# Patient Record
Sex: Female | Born: 1975 | Race: Black or African American | Hispanic: No | Marital: Single | Smoking: Former smoker
Health system: Southern US, Community
[De-identification: ages and names within clinical notes are randomized; demographics above are authoritative.]

## PROBLEM LIST (undated history)

## (undated) ENCOUNTER — Inpatient Hospital Stay (HOSPITAL_COMMUNITY): Payer: Self-pay

## (undated) DIAGNOSIS — F419 Anxiety disorder, unspecified: Secondary | ICD-10-CM

## (undated) DIAGNOSIS — D649 Anemia, unspecified: Secondary | ICD-10-CM

## (undated) DIAGNOSIS — D219 Benign neoplasm of connective and other soft tissue, unspecified: Secondary | ICD-10-CM

## (undated) DIAGNOSIS — B009 Herpesviral infection, unspecified: Secondary | ICD-10-CM

## (undated) DIAGNOSIS — I10 Essential (primary) hypertension: Secondary | ICD-10-CM

## (undated) DIAGNOSIS — M1990 Unspecified osteoarthritis, unspecified site: Secondary | ICD-10-CM

## (undated) DIAGNOSIS — Z8759 Personal history of other complications of pregnancy, childbirth and the puerperium: Secondary | ICD-10-CM

## (undated) DIAGNOSIS — F32A Depression, unspecified: Secondary | ICD-10-CM

## (undated) DIAGNOSIS — R519 Headache, unspecified: Secondary | ICD-10-CM

## (undated) DIAGNOSIS — R7303 Prediabetes: Secondary | ICD-10-CM

## (undated) DIAGNOSIS — R51 Headache: Secondary | ICD-10-CM

## (undated) DIAGNOSIS — E559 Vitamin D deficiency, unspecified: Secondary | ICD-10-CM

## (undated) HISTORY — PX: URETHRAL CYST REMOVAL: SHX5128

## (undated) HISTORY — PX: THERAPEUTIC ABORTION: SHX798

---

## 1998-01-26 ENCOUNTER — Emergency Department (HOSPITAL_COMMUNITY): Admission: EM | Admit: 1998-01-26 | Discharge: 1998-01-26 | Payer: Self-pay | Admitting: Emergency Medicine

## 1998-06-20 ENCOUNTER — Other Ambulatory Visit: Admission: RE | Admit: 1998-06-20 | Discharge: 1998-06-20 | Payer: Self-pay | Admitting: Gynecology

## 2000-04-20 ENCOUNTER — Emergency Department (HOSPITAL_COMMUNITY): Admission: EM | Admit: 2000-04-20 | Discharge: 2000-04-20 | Payer: Self-pay | Admitting: Emergency Medicine

## 2001-09-16 ENCOUNTER — Other Ambulatory Visit: Admission: RE | Admit: 2001-09-16 | Discharge: 2001-09-16 | Payer: Self-pay | Admitting: Obstetrics and Gynecology

## 2003-01-25 ENCOUNTER — Other Ambulatory Visit: Admission: RE | Admit: 2003-01-25 | Discharge: 2003-01-25 | Payer: Self-pay | Admitting: Obstetrics and Gynecology

## 2008-04-22 ENCOUNTER — Emergency Department (HOSPITAL_COMMUNITY): Admission: EM | Admit: 2008-04-22 | Discharge: 2008-04-22 | Payer: Self-pay | Admitting: Emergency Medicine

## 2010-06-12 ENCOUNTER — Emergency Department (HOSPITAL_BASED_OUTPATIENT_CLINIC_OR_DEPARTMENT_OTHER)
Admission: EM | Admit: 2010-06-12 | Discharge: 2010-06-12 | Disposition: A | Discharge status: Home / Self Care | Payer: Federal, State, Local not specified - PPO | Attending: Emergency Medicine | Admitting: Emergency Medicine

## 2010-06-12 ENCOUNTER — Emergency Department (INDEPENDENT_AMBULATORY_CARE_PROVIDER_SITE_OTHER): Payer: Federal, State, Local not specified - PPO

## 2010-06-12 DIAGNOSIS — R079 Chest pain, unspecified: Secondary | ICD-10-CM

## 2010-06-12 DIAGNOSIS — R0602 Shortness of breath: Secondary | ICD-10-CM

## 2010-06-12 DIAGNOSIS — M546 Pain in thoracic spine: Secondary | ICD-10-CM | POA: Insufficient documentation

## 2010-06-12 LAB — COMPREHENSIVE METABOLIC PANEL
ALT: 21 U/L (ref 0–35)
AST: 20 U/L (ref 0–37)
Alkaline Phosphatase: 66 U/L (ref 39–117)
Calcium: 8.9 mg/dL (ref 8.4–10.5)
GFR calc Af Amer: >60 mL/min (ref >60)
Glucose, Bld: 92 mg/dL (ref 70–99)
Potassium: 3.5 mEq/L (ref 3.5–5.1)
Sodium: 139 mEq/L (ref 135–145)
Total Protein: 7.7 g/dL (ref 6.0–8.3)

## 2010-06-12 LAB — DIFFERENTIAL
Basophils Absolute: 0 10*3/uL (ref 0.0–0.1)
Basophils Relative: 0 % (ref 0–1)
Eosinophils Relative: 1 % (ref 0–5)
Lymphocytes Relative: 41 % (ref 12–46)
Neutro Abs: 2.8 10*3/uL (ref 1.7–7.7)

## 2010-06-12 LAB — POCT CARDIAC MARKERS
CKMB, poc: <1 ng/mL — ABNORMAL LOW (ref 1.0–8.0)
CKMB, poc: <1 ng/mL — ABNORMAL LOW (ref 1.0–8.0)
Myoglobin, poc: 46.2 ng/mL (ref 12–200)
Myoglobin, poc: 43.2 ng/mL (ref 12–200)
Troponin i, poc: <0.05 ng/mL (ref 0.00–0.09)

## 2010-06-12 LAB — CBC
HCT: 36.5 % (ref 36.0–46.0)
Hemoglobin: 12.5 g/dL (ref 12.0–15.0)
MCHC: 34.2 g/dL (ref 30.0–36.0)
RDW: 12.1 % (ref 11.5–15.5)
WBC: 5.4 10*3/uL (ref 4.0–10.5)

## 2010-06-12 LAB — LIPASE, BLOOD: Lipase: 64 U/L (ref 23–300)

## 2010-08-01 ENCOUNTER — Other Ambulatory Visit (HOSPITAL_COMMUNITY): Payer: Self-pay | Admitting: Urology

## 2010-08-01 DIAGNOSIS — IMO0001 Reserved for inherently not codable concepts without codable children: Secondary | ICD-10-CM

## 2010-08-01 DIAGNOSIS — N2882 Megaloureter: Secondary | ICD-10-CM

## 2010-08-07 ENCOUNTER — Ambulatory Visit (HOSPITAL_COMMUNITY)
Admission: RE | Admit: 2010-08-07 | Discharge: 2010-08-07 | Disposition: A | Discharge status: Home / Self Care | Payer: Federal, State, Local not specified - PPO | Source: Ambulatory Visit | Attending: Urology | Admitting: Urology

## 2010-08-07 DIAGNOSIS — D259 Leiomyoma of uterus, unspecified: Secondary | ICD-10-CM | POA: Insufficient documentation

## 2010-08-07 DIAGNOSIS — N368 Other specified disorders of urethra: Secondary | ICD-10-CM | POA: Insufficient documentation

## 2010-08-07 DIAGNOSIS — IMO0001 Reserved for inherently not codable concepts without codable children: Secondary | ICD-10-CM

## 2010-08-07 MED ORDER — GADOBENATE DIMEGLUMINE 529 MG/ML IV SOLN
20.0000 mL | Freq: Once | INTRAVENOUS | Status: AC | PRN
  Administered 2010-08-07: 20 mL via INTRAVENOUS

## 2010-11-16 ENCOUNTER — Ambulatory Visit (HOSPITAL_BASED_OUTPATIENT_CLINIC_OR_DEPARTMENT_OTHER)
Admission: RE | Admit: 2010-11-16 | Discharge: 2010-11-16 | Disposition: A | Discharge status: Home / Self Care | Payer: Federal, State, Local not specified - PPO | Source: Ambulatory Visit | Attending: Urology | Admitting: Urology

## 2010-11-16 ENCOUNTER — Other Ambulatory Visit: Payer: Self-pay | Admitting: Urology

## 2010-11-16 DIAGNOSIS — N398 Other specified disorders of urinary system: Secondary | ICD-10-CM | POA: Insufficient documentation

## 2010-11-18 LAB — CULTURE, ROUTINE-GENITAL

## 2010-11-20 NOTE — Op Note (Signed)
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  OPERATION:  Transvaginal excision of infected periurethral cyst.  SURGEON:  Maureen Schwartz, M.D.  ANESTHESIA:  General LMA.  PREPARATION:  After appropriate preanesthesia, the patient was brought to the operating room, placed on the operating room in dorsal supine position, where general LMA anesthesia was induced.  She was then replaced in dorsal lithotomy position, where the pubis was prepped with Betadine solution and draped in usual fashion.  REVIEW OF HISTORY:  This 35 year old female has a history of a left periurethral mass, measuring approximately 2 cm.  The mass was nonpainful, and the patient has noted some discharge intermittently over the past 5 years.  No bleeding was noted.  She had an MRI, which did not show urethral diverticulum, but did show what appeared to be urethral cyst.  She is now for excision.  PROCEDURE IN DETAIL:  With the patient in the dorsal lithotomy position, vaginal inspection reveals a well-estrogenized vaginal tissue.  There was a 2-cm left periurethral mass, with deviation of the ureter to the right side.  A diluted indigo carmine was injected, 10 mL, through the urethra into the bladder.  The purpose of this was to stain the urethra blue, and, if there was a communicating diverticulum, we would see the blue within the mass.  2 mL of 0.25 with epinephrine 1:100,000 was injected over the periurethral mass.   A 2-cm curvilinear incision was then made and subcutaneous tissue dissected with sharp and blunt dissection.  The mass was identified, dissected off the urethral wall. The urethral wall was very thin, I could see blue of the urethral mucosa, although no direct urethral dissection was accomplished.  The mass was dissected more proximally, and I was careful to avoid deep dissection into the periurethral structures and sphincter structures. The mass was entered, and pus was identified.  This was cultured with both aerobic and anaerobic cultures.  She had been given IV antibiotic, 2 g of Ancef, at the beginning of the procedure.  Remaining mass was removed.  Bleeding was noted from the deep periurethral vascular tissue, and was controlled electrosurgically. Because of thinning of the urethral mucosa, and because of bleeding, I elected to place a piece of Xenform, measuring approximately 1 cm x 1 cm, up against the ureteral sidewall, and place approximately 5 mL of FloSeal in the wound, and held the pressure for approximately 3 minutes. No further bleeding was noted.  Cystoscopy was performed, which showed edema and irritation of the left side of the urethra wall.  There was no open urethrotomy noted, however.  I then closed the wound with two separate 3-0 Monocryl sutures, and 4-0 running Monocryl vaginal suture.  Foley catheter was  placed.  Plan will be to leave Foley catheter for 10 days, then obtain pull out urethrogram.  The patient received IV Tylenol before the beginning of procedure, and IV Toradol at the end of procedure.  She also received B and O suppository for pain relief.  Foley catheter was left in position with a plug and a leg bag.  She was awakened, taken to Recovery in good condition.     Maureen Schwartz, M.D.     SIT/MEDQ  D:  11/16/2010  T:  11/16/2010  Job:  [id number]  cc:   Maxie Better, M.D. Fax: [phone]  Electronically Signed by Maureen Schwartz M.D. on 11/20/2010 05:01:55 PM

## 2010-11-21 LAB — ANAEROBIC CULTURE

## 2010-11-23 ENCOUNTER — Other Ambulatory Visit (HOSPITAL_COMMUNITY): Payer: Self-pay | Admitting: Urology

## 2010-11-24 ENCOUNTER — Emergency Department (HOSPITAL_COMMUNITY)
Admission: EM | Admit: 2010-11-24 | Discharge: 2010-11-24 | Disposition: A | Discharge status: Home / Self Care | Payer: Federal, State, Local not specified - PPO | Attending: Emergency Medicine | Admitting: Emergency Medicine

## 2010-11-24 DIAGNOSIS — R300 Dysuria: Secondary | ICD-10-CM | POA: Insufficient documentation

## 2010-11-24 DIAGNOSIS — Z9889 Other specified postprocedural states: Secondary | ICD-10-CM | POA: Insufficient documentation

## 2010-11-24 DIAGNOSIS — B379 Candidiasis, unspecified: Secondary | ICD-10-CM | POA: Insufficient documentation

## 2010-11-24 LAB — URINALYSIS, ROUTINE W REFLEX MICROSCOPIC
Bilirubin Urine: NEGATIVE
Glucose, UA: NEGATIVE mg/dL
Ketones, ur: NEGATIVE mg/dL
Leukocytes, UA: NEGATIVE
Protein, ur: NEGATIVE mg/dL
pH: 5.5 (ref 5.0–8.0)

## 2010-11-24 LAB — URINE MICROSCOPIC-ADD ON

## 2010-11-26 ENCOUNTER — Other Ambulatory Visit (HOSPITAL_COMMUNITY): Payer: Self-pay | Admitting: Urology

## 2010-11-26 DIAGNOSIS — N361 Urethral diverticulum: Secondary | ICD-10-CM

## 2010-11-28 ENCOUNTER — Other Ambulatory Visit (HOSPITAL_COMMUNITY): Payer: Federal, State, Local not specified - PPO

## 2011-10-22 ENCOUNTER — Other Ambulatory Visit: Payer: Self-pay | Admitting: Dermatology

## 2014-07-26 ENCOUNTER — Encounter (HOSPITAL_COMMUNITY): Payer: Self-pay | Admitting: Emergency Medicine

## 2014-07-26 ENCOUNTER — Emergency Department (INDEPENDENT_AMBULATORY_CARE_PROVIDER_SITE_OTHER): Payer: Federal, State, Local not specified - PPO

## 2014-07-26 ENCOUNTER — Emergency Department (HOSPITAL_COMMUNITY)
Admission: EM | Admit: 2014-07-26 | Discharge: 2014-07-26 | Disposition: A | Discharge status: Home / Self Care | Payer: Federal, State, Local not specified - PPO | Source: Home / Self Care | Attending: Family Medicine | Admitting: Family Medicine

## 2014-07-26 DIAGNOSIS — J069 Acute upper respiratory infection, unspecified: Secondary | ICD-10-CM

## 2014-07-26 DIAGNOSIS — R059 Cough, unspecified: Secondary | ICD-10-CM

## 2014-07-26 DIAGNOSIS — R05 Cough: Secondary | ICD-10-CM

## 2014-07-26 MED ORDER — IPRATROPIUM BROMIDE 0.06 % NA SOLN
2.0000 | Freq: Four times a day (QID) | NASAL | Status: DC
Start: 2014-07-26 — End: 2018-09-04

## 2014-07-26 MED ORDER — BENZONATATE 100 MG PO CAPS: 100.0000 mg | ORAL_CAPSULE | Freq: Three times a day (TID) | ORAL | Status: DC | PRN

## 2014-07-26 NOTE — ED Provider Notes (Signed)
CSN: [id number]     Arrival date & time 07/26/14  1125 History   First MD Initiated Contact with Patient 07/26/14 1307     Chief Complaint  Patient presents with  . URI   (Consider location/radiation/quality/duration/timing/severity/associated sxs/prior Treatment) HPI Comments: Patient states symptoms began about 3 weeks ago. ??Seasonal allergies?? Cough has become a bit more persistent and patient states a friend was recently diagnosed with pneumonia, so she decided to come in to make sure she does not also have pneumonia.  No fevers Nonsmoker Works for [employer]  Patient is a 39 y.o. female presenting with URI.  URI Presenting symptoms: congestion, cough, rhinorrhea and sore throat   Presenting symptoms: no fatigue and no fever   Severity:  Moderate Onset quality:  Gradual Associated symptoms: sneezing   Associated symptoms: no headaches and no wheezing     History reviewed. No pertinent past medical history. No past surgical history on file. History reviewed. No pertinent family history. History  Substance Use Topics  . Smoking status: Not on file  . Smokeless tobacco: Not on file  . Alcohol Use: Not on file   OB History    No data available     Review of Systems  Constitutional: Negative for fever, chills and fatigue.  HENT: Positive for congestion, rhinorrhea, sneezing and sore throat.   Eyes: Negative.   Respiratory: Positive for cough. Negative for chest tightness, shortness of breath and wheezing.   Cardiovascular: Negative.   Gastrointestinal: Negative.   Musculoskeletal: Negative for back pain.  Skin: Negative.   Neurological: Negative for dizziness, syncope, light-headedness and headaches.    Allergies  Review of patient's allergies indicates no known allergies.  Home Medications   Prior to Admission medications   Medication Sig Start Date End Date Taking? Authorizing Provider  benzonatate (TESSALON) 100 MG capsule Take 1 capsule (100 mg total)  by mouth 3 (three) times daily as needed for cough. 07/26/14   Audelia Hives Megan Presti, PA  ipratropium (ATROVENT) 0.06 % nasal spray Place 2 sprays into both nostrils 4 (four) times daily. 07/26/14   Annett Gula H Rex Magee, PA   BP 120/82 mmHg  Pulse 77  Temp(Src) 100.1 F (37.8 C) (Oral)  Resp 15  SpO2 99%  LMP 07/15/2014 Physical Exam  Constitutional: She is oriented to person, place, and time. She appears well-developed and well-nourished. No distress.  HENT:  Head: Normocephalic and atraumatic.  Right Ear: Hearing, tympanic membrane, external ear and ear canal normal.  Left Ear: Hearing, tympanic membrane, external ear and ear canal normal.  Nose: Nose normal.  Mouth/Throat: Uvula is midline, oropharynx is clear and moist and mucous membranes are normal.  Eyes: Conjunctivae are normal.  Neck: Normal range of motion. Neck supple.  Cardiovascular: Normal rate, regular rhythm and normal heart sounds.   Pulmonary/Chest: Effort normal and breath sounds normal. No respiratory distress. She has no wheezes.  Musculoskeletal: Normal range of motion.  Lymphadenopathy:    She has no cervical adenopathy.  Neurological: She is alert and oriented to person, place, and time.  Skin: Skin is warm and dry.  Psychiatric: She has a normal mood and affect. Her behavior is normal.  Nursing note and vitals reviewed.   ED Course  Procedures (including critical care time) Labs Review Labs Reviewed - No data to display  Imaging Review Dg Chest 2 View  07/26/2014   CLINICAL DATA:  Cough, sneezing, sore throat, fever. Symptoms for 3 weeks.  EXAM: CHEST  2 VIEW  COMPARISON:  06/12/2010  FINDINGS: Heart and mediastinal contours are within normal limits. No focal opacities or effusions. No acute bony abnormality.  IMPRESSION: No active cardiopulmonary disease.   Electronically Signed   By: Rolm Baptise M.D.   On: 07/26/2014 14:00     MDM   1. Cough   2. URI (upper respiratory infection)   xrays were  normal. no pneumonia or bronchitis. Advised patient that in addition to the medications  prescribed here today,  would also recommend patient begin taking medication for seasonal allergy symptoms, such as Zyrtec or Claritin and Flonase, on a daily basis. If symptoms do not improve, please follow up with your primary care provider.  Lutricia Feil, [place] 07/26/14 [phone]

## 2014-07-26 NOTE — ED Notes (Signed)
C/o cold sx States she has body aches and sinus pressure States she has a cough that causes back pain States she was around another young lady whom recently done xray showed pneumonia

## 2014-07-26 NOTE — Discharge Instructions (Signed)
Your xrays were normal. You do not have pneumonia or bronchitis. In addition to the medications you have been prescribed here today, I would also recommend you begin taking medication for seasonal allergy symptoms, such as Zyrtec or Claritin and Flonase, on a daily basis. If symptoms do not improve, please follow up with your primary care provider.  Cough, Adult  A cough is a reflex that helps clear your throat and airways. It can help heal the body or may be a reaction to an irritated airway. A cough may only last 2 or 3 weeks (acute) or may last more than 8 weeks (chronic).  CAUSES Acute cough:  Viral or bacterial infections. Chronic cough:  Infections.  Allergies.  Asthma.  Post-nasal drip.  Smoking.  Heartburn or acid reflux.  Some medicines.  Chronic lung problems (COPD).  Cancer. SYMPTOMS   Cough.  Fever.  Chest pain.  Increased breathing rate.  High-pitched whistling sound when breathing (wheezing).  Colored mucus that you cough up (sputum). TREATMENT   A bacterial cough may be treated with antibiotic medicine.  A viral cough must run its course and will not respond to antibiotics.  Your caregiver may recommend other treatments if you have a chronic cough. HOME CARE INSTRUCTIONS   Only take over-the-counter or prescription medicines for pain, discomfort, or fever as directed by your caregiver. Use cough suppressants only as directed by your caregiver.  Use a cold steam vaporizer or humidifier in your bedroom or home to help loosen secretions.  Sleep in a semi-upright position if your cough is worse at night.  Rest as needed.  Stop smoking if you smoke. SEEK IMMEDIATE MEDICAL CARE IF:   You have pus in your sputum.  Your cough starts to worsen.  You cannot control your cough with suppressants and are losing sleep.  You begin coughing up blood.  You have difficulty breathing.  You develop pain which is getting worse or is uncontrolled with  medicine.  You have a fever. MAKE SURE YOU:   Understand these instructions.  Will watch your condition.  Will get help right away if you are not doing well or get worse. Document Released: 09/14/2010 Document Revised: 06/10/2011 Document Reviewed: 09/14/2010 [facility] Patient Information 2015 [place], [place]. This information is not intended to replace advice given to you by your health care provider. Make sure you discuss any questions you have with your health care provider.  Upper Respiratory Infection, Adult An upper respiratory infection (URI) is also sometimes known as the common cold. The upper respiratory tract includes the nose, sinuses, throat, trachea, and bronchi. Bronchi are the airways leading to the lungs. Most people improve within 1 week, but symptoms can last up to 2 weeks. A residual cough may last even longer.  CAUSES Many different viruses can infect the tissues lining the upper respiratory tract. The tissues become irritated and inflamed and often become very moist. Mucus production is also common. A cold is contagious. You can easily spread the virus to others by oral contact. This includes kissing, sharing a glass, coughing, or sneezing. Touching your mouth or nose and then touching a surface, which is then touched by another person, can also spread the virus. SYMPTOMS  Symptoms typically develop 1 to 3 days after you come in contact with a cold virus. Symptoms vary from person to person. They may include:  Runny nose.  Sneezing.  Nasal congestion.  Sinus irritation.  Sore throat.  Loss of voice (laryngitis).  Cough.  Fatigue.  Muscle aches.  Loss of appetite.  Headache.  Low-grade fever. DIAGNOSIS  You might diagnose your own cold based on familiar symptoms, since most people get a cold 2 to 3 times a year. Your caregiver can confirm this based on your exam. Most importantly, your caregiver can check that your symptoms are not due to another  disease such as strep throat, sinusitis, pneumonia, asthma, or epiglottitis. Blood tests, throat tests, and X-rays are not necessary to diagnose a common cold, but they may sometimes be helpful in excluding other more serious diseases. Your caregiver will decide if any further tests are required. RISKS AND COMPLICATIONS  You may be at risk for a more severe case of the common cold if you smoke cigarettes, have chronic heart disease (such as heart failure) or lung disease (such as asthma), or if you have a weakened immune system. The very young and very old are also at risk for more serious infections. Bacterial sinusitis, middle ear infections, and bacterial pneumonia can complicate the common cold. The common cold can worsen asthma and chronic obstructive pulmonary disease (COPD). Sometimes, these complications can require emergency medical care and may be life-threatening. PREVENTION  The best way to protect against getting a cold is to practice good hygiene. Avoid oral or hand contact with people with cold symptoms. Wash your hands often if contact occurs. There is no clear evidence that vitamin C, vitamin E, echinacea, or exercise reduces the chance of developing a cold. However, it is always recommended to get plenty of rest and practice good nutrition. TREATMENT  Treatment is directed at relieving symptoms. There is no cure. Antibiotics are not effective, because the infection is caused by a virus, not by bacteria. Treatment may include:  Increased fluid intake. Sports drinks offer valuable electrolytes, sugars, and fluids.  Breathing heated mist or steam (vaporizer or shower).  Eating chicken soup or other clear broths, and maintaining good nutrition.  Getting plenty of rest.  Using gargles or lozenges for comfort.  Controlling fevers with ibuprofen or acetaminophen as directed by your caregiver.  Increasing usage of your inhaler if you have asthma. Zinc gel and zinc lozenges, taken in  the first 24 hours of the common cold, can shorten the duration and lessen the severity of symptoms. Pain medicines may help with fever, muscle aches, and throat pain. A variety of non-prescription medicines are available to treat congestion and runny nose. Your caregiver can make recommendations and may suggest nasal or lung inhalers for other symptoms.  HOME CARE INSTRUCTIONS   Only take over-the-counter or prescription medicines for pain, discomfort, or fever as directed by your caregiver.  Use a warm mist humidifier or inhale steam from a shower to increase air moisture. This may keep secretions moist and make it easier to breathe.  Drink enough water and fluids to keep your urine clear or pale yellow.  Rest as needed.  Return to work when your temperature has returned to normal or as your caregiver advises. You may need to stay home longer to avoid infecting others. You can also use a face mask and careful hand washing to prevent spread of the virus. SEEK MEDICAL CARE IF:   After the first few days, you feel you are getting worse rather than better.  You need your caregiver's advice about medicines to control symptoms.  You develop chills, worsening shortness of breath, or brown or red sputum. These may be signs of pneumonia.  You develop yellow or brown nasal discharge or pain  in the face, especially when you bend forward. These may be signs of sinusitis.  You develop a fever, swollen neck glands, pain with swallowing, or white areas in the back of your throat. These may be signs of strep throat. SEEK IMMEDIATE MEDICAL CARE IF:   You have a fever.  You develop severe or persistent headache, ear pain, sinus pain, or chest pain.  You develop wheezing, a prolonged cough, cough up blood, or have a change in your usual mucus (if you have chronic lung disease).  You develop sore muscles or a stiff neck. Document Released: 09/11/2000 Document Revised: 06/10/2011 Document Reviewed:  06/23/2013 [facility] Patient Information 2015 [place], [place]. This information is not intended to replace advice given to you by your health care provider. Make sure you discuss any questions you have with your health care provider.

## 2014-10-25 ENCOUNTER — Other Ambulatory Visit: Payer: Self-pay | Admitting: Obstetrics and Gynecology

## 2014-10-31 LAB — OB RESULTS CONSOLE HEPATITIS B SURFACE ANTIGEN: HEP B S AG: NEGATIVE

## 2014-10-31 LAB — OB RESULTS CONSOLE RPR: RPR: NONREACTIVE

## 2014-10-31 LAB — OB RESULTS CONSOLE HIV ANTIBODY (ROUTINE TESTING): HIV: NONREACTIVE

## 2014-10-31 LAB — OB RESULTS CONSOLE RUBELLA ANTIBODY, IGM: Rubella: IMMUNE

## 2014-10-31 LAB — OB RESULTS CONSOLE ABO/RH: RH Type: POSITIVE

## 2014-10-31 LAB — OB RESULTS CONSOLE ANTIBODY SCREEN: ANTIBODY SCREEN: NEGATIVE

## 2014-11-14 LAB — OB RESULTS CONSOLE GC/CHLAMYDIA
CHLAMYDIA, DNA PROBE: NEGATIVE
GC PROBE AMP, GENITAL: NEGATIVE

## 2015-01-23 ENCOUNTER — Encounter (HOSPITAL_COMMUNITY): Payer: Self-pay | Admitting: *Deleted

## 2015-01-23 ENCOUNTER — Inpatient Hospital Stay (HOSPITAL_COMMUNITY)
Admission: EM | Admit: 2015-01-23 | Discharge: 2015-01-23 | Disposition: A | Discharge status: Home / Self Care | Payer: Federal, State, Local not specified - PPO | Source: Ambulatory Visit | Attending: Obstetrics and Gynecology | Admitting: Obstetrics and Gynecology

## 2015-01-23 DIAGNOSIS — Z3A20 20 weeks gestation of pregnancy: Secondary | ICD-10-CM | POA: Diagnosis not present

## 2015-01-23 DIAGNOSIS — B009 Herpesviral infection, unspecified: Secondary | ICD-10-CM | POA: Diagnosis not present

## 2015-01-23 DIAGNOSIS — D259 Leiomyoma of uterus, unspecified: Secondary | ICD-10-CM | POA: Diagnosis not present

## 2015-01-23 DIAGNOSIS — R109 Unspecified abdominal pain: Secondary | ICD-10-CM | POA: Diagnosis present

## 2015-01-23 DIAGNOSIS — O98512 Other viral diseases complicating pregnancy, second trimester: Secondary | ICD-10-CM | POA: Diagnosis not present

## 2015-01-23 DIAGNOSIS — O3412 Maternal care for benign tumor of corpus uteri, second trimester: Secondary | ICD-10-CM | POA: Insufficient documentation

## 2015-01-23 DIAGNOSIS — O26899 Other specified pregnancy related conditions, unspecified trimester: Secondary | ICD-10-CM

## 2015-01-23 HISTORY — DX: Herpesviral infection, unspecified: B00.9

## 2015-01-23 HISTORY — DX: Headache: R51

## 2015-01-23 HISTORY — DX: Headache, unspecified: R51.9

## 2015-01-23 HISTORY — DX: Benign neoplasm of connective and other soft tissue, unspecified: D21.9

## 2015-01-23 HISTORY — DX: Anemia, unspecified: D64.9

## 2015-01-23 LAB — URINALYSIS W MICROSCOPIC (NOT AT ARMC)
Glucose, UA: NEGATIVE mg/dL
Hgb urine dipstick: NEGATIVE
LEUKOCYTES UA: NEGATIVE
NITRITE: NEGATIVE
PROTEIN: NEGATIVE mg/dL
Specific Gravity, Urine: 1.025 (ref 1.005–1.030)
Urobilinogen, UA: 0.2 mg/dL (ref 0.0–1.0)
pH: 6 (ref 5.0–8.0)

## 2015-01-23 MED ORDER — IBUPROFEN 800 MG PO TABS: 800.0000 mg | ORAL_TABLET | Freq: Three times a day (TID) | ORAL | Status: AC | PRN

## 2015-01-23 MED ORDER — HYDROMORPHONE HCL 2 MG PO TABS: 2.0000 mg | ORAL_TABLET | ORAL | Status: DC | PRN

## 2015-01-23 MED ORDER — LACTATED RINGERS IV BOLUS (SEPSIS)
1000.0000 mL | Freq: Once | INTRAVENOUS | Status: AC
  Administered 2015-01-23: 1000 mL via INTRAVENOUS

## 2015-01-23 MED ORDER — HYDROMORPHONE HCL 2 MG PO TABS
2.0000 mg | ORAL_TABLET | Freq: Once | ORAL | Status: AC
  Administered 2015-01-23: 2 mg via ORAL
  Filled 2015-01-23: qty 1

## 2015-01-23 NOTE — MAU Provider Note (Signed)
History     CSN: [id number]  Arrival date and time: 01/23/15 2035  Orders placed in EPIC: 2050 Provider at bedside: 2055    HPI  Ms. Maureen Schwartz is a 39 yo G3P0020 female at 20.[redacted] wks gestation by LMP, presenting with complaints of increasing abdominal and back pain.  She has a known 12 cm fundal fibroid. Pain was 9/10 before getting here, now 4/10.  Denies VB or LOF.  (+) flutters. Her primary OB provider at [facility] is Dr. Garwin Brothers.  Past Medical History  Diagnosis Date  . Fibroid   . Headache   . Anemia   . Herpes     last outbreak July 2016    Past Surgical History  Procedure Laterality Date  . Therapeutic abortion      Family History  Problem Relation Age of Onset  . Diabetes Maternal Aunt   . Kidney disease Maternal Aunt   . Cancer Maternal Uncle     Social History  Substance Use Topics  . Smoking status: Not on file  . Smokeless tobacco: Not on file  . Alcohol Use: Not on file    Allergies: No Known Allergies  Prescriptions prior to admission  Medication Sig Dispense Refill Last Dose  . benzonatate (TESSALON) 100 MG capsule Take 1 capsule (100 mg total) by mouth 3 (three) times daily as needed for cough. 21 capsule 0   . ipratropium (ATROVENT) 0.06 % nasal spray Place 2 sprays into both nostrils 4 (four) times daily. 15 mL 0     Review of Systems  Constitutional: Negative.   HENT: Negative.   Eyes: Negative.   Respiratory: Negative.   Cardiovascular: Negative.   Gastrointestinal: Positive for nausea and constipation.  Genitourinary: Negative.   Musculoskeletal: Negative.   Skin: Negative.   Neurological: Negative.   Endo/Heme/Allergies: Negative.   Psychiatric/Behavioral: Negative.    Results for orders placed or performed during the hospital encounter of 01/23/15 (from the past 24 hour(s))  Urinalysis with microscopic     Status: Abnormal   Collection Time: 01/23/15  9:20 PM  Result Value Ref Range   Color, Urine YELLOW YELLOW   APPearance  CLEAR CLEAR   Specific Gravity, Urine 1.025 1.005 - 1.030   pH 6.0 5.0 - 8.0   Glucose, UA NEGATIVE NEGATIVE mg/dL   Hgb urine dipstick NEGATIVE NEGATIVE   Bilirubin Urine SMALL (A) NEGATIVE   Ketones, ur >80 (A) NEGATIVE mg/dL   Protein, ur NEGATIVE NEGATIVE mg/dL   Urobilinogen, UA 0.2 0.0 - 1.0 mg/dL   Nitrite NEGATIVE NEGATIVE   Leukocytes, UA NEGATIVE NEGATIVE   Bacteria, UA RARE RARE   Squamous Epithelial / LPF RARE RARE   Urine-Other MUCOUS PRESENT    FHTs: 148 bpm  Physical Exam   Blood pressure 129/69, pulse 92, temperature 98.5 F (36.9 C), temperature source Oral, resp. rate 16, last menstrual period 08/30/2014.  Physical Exam  Constitutional: She is oriented to person, place, and time. She appears well-developed and well-nourished.  HENT:  Head: Normocephalic and atraumatic.  Eyes: Pupils are equal, round, and reactive to light.  Neck: Normal range of motion.  Cardiovascular: Normal rate, regular rhythm, normal heart sounds and intact distal pulses.   Respiratory: Effort normal and breath sounds normal.  GI: Soft. Bowel sounds are normal.  Genitourinary:  S>D; gravid; mildly firm uterus - compressible; VE: closed/thick/high/soft/unable to determine presenting part  Musculoskeletal: Normal range of motion.  Neurological: She is alert and oriented to person, place, and time. She has  normal reflexes.  Skin: Skin is warm and dry.  Psychiatric: She has a normal mood and affect. Her behavior is normal. Judgment and thought content normal.    MAU Course  Procedures CCUA - results as above UCx - pending LR 1000 ml bolus Dilaudid 2 mg po x 1 po - pain decreased from 4/10 to 2/10 Assessment and Plan  G3P0020 at 20.[redacted] wks gestation Uterine Fibroid affecting pregnancy in 2nd trimester  Discharge home  Increase water intake Eat small frequent protein rich snacks/meals every 2-3 hrs Keep scheduled appointment with Dr. Garwin Brothers on 11/14  *Consult with Dr. Garwin Brothers -  recommend Dilaudid 2 mg po every 4 hrs prn severe pain, Ibuprofen 800 mg every 8 hrs x 48 hrs  Laury Deep, M MSN, CNM 01/23/2015, 8:58 PM

## 2015-01-23 NOTE — MAU Note (Signed)
Pt G3P0 at 20w 3d with c/o an abd heaviness and pressure since Oct 21st.  Having pain that is dull and achy yesterday and even more pronounced pain today.  No vaginal bleeding. Fetal heart tones 148.

## 2015-01-23 NOTE — Discharge Instructions (Signed)
Uterine Fibroids Uterine fibroids are tissue masses (tumors) that can develop in the womb (uterus). They are also called leiomyomas. This type of tumor is not cancerous (benign) and does not spread to other parts of the body outside of the pelvic area, which is between the hip bones. Occasionally, fibroids may develop in the fallopian tubes, in the cervix, or on the support structures (ligaments) that surround the uterus. You can have one or many fibroids. Fibroids can vary in size, weight, and where they grow in the uterus. Some can become quite large. Most fibroids do not require medical treatment. CAUSES A fibroid can develop when a single uterine cell keeps growing (replicating). Most cells in the human body have a control mechanism that keeps them from replicating without control. SIGNS AND SYMPTOMS Symptoms may include:   Heavy bleeding during your period.  Bleeding or spotting between periods.  Pelvic pain and pressure.  Bladder problems, such as needing to urinate more often (urinary frequency) or urgently.  Inability to reproduce offspring (infertility).  Miscarriages. DIAGNOSIS Uterine fibroids are diagnosed through a physical exam. Your health care provider may feel the lumpy tumors during a pelvic exam. Ultrasonography and an MRI may be done to determine the size, location, and number of fibroids. TREATMENT Treatment may include:  Watchful waiting. This involves getting the fibroid checked by your health care provider to see if it grows or shrinks. Follow your health care provider's recommendations for how often to have this checked.  Hormone medicines. These can be taken by mouth or given through an intrauterine device (IUD).  Surgery.  Removing the fibroids (myomectomy) or the uterus (hysterectomy).  Removing blood supply to the fibroids (uterine artery embolization). If fibroids interfere with your fertility and you want to become pregnant, your health care provider  may recommend having the fibroids removed.  HOME CARE INSTRUCTIONS  Keep all follow-up visits as directed by your health care provider. This is important.  Take medicines only as directed by your health care provider.  If you were prescribed a hormone treatment, take the hormone medicines exactly as directed.  Do not take aspirin, because it can cause bleeding.  Ask your health care provider about taking iron pills and increasing the amount of dark green, leafy vegetables in your diet. These actions can help to boost your blood iron levels, which may be affected by heavy menstrual bleeding.  Pay close attention to your period and tell your health care provider about any changes, such as:  Increased blood flow that requires you to use more pads or tampons than usual per month.  A change in the number of days that your period lasts per month.  A change in symptoms that are associated with your period, such as abdominal cramping or back pain. SEEK MEDICAL CARE IF:  You have pelvic pain, back pain, or abdominal cramps that cannot be controlled with medicines.  You have an increase in bleeding between and during periods.  You soak tampons or pads in a half hour or less.  You feel lightheaded, extra tired, or weak. SEEK IMMEDIATE MEDICAL CARE IF:  You faint.  You have a sudden increase in pelvic pain.   This information is not intended to replace advice given to you by your health care provider. Make sure you discuss any questions you have with your health care provider.   Document Released: 03/15/2000 Document Revised: 04/08/2014 Document Reviewed: 09/14/2013 Elsevier Interactive Patient Education 2016 [employer] Abdominal Pain During Pregnancy Abdominal pain is  common in pregnancy. Most of the time, it does not cause harm. There are many causes of abdominal pain. Some causes are more serious than others. Some of the causes of abdominal pain in pregnancy are easily diagnosed.  Occasionally, the diagnosis takes time to understand. Other times, the cause is not determined. Abdominal pain can be a sign that something is very wrong with the pregnancy, or the pain may have nothing to do with the pregnancy at all. For this reason, always tell your health care provider if you have any abdominal discomfort. HOME CARE INSTRUCTIONS  Monitor your abdominal pain for any changes. The following actions may help to alleviate any discomfort you are experiencing:  Do not have sexual intercourse or put anything in your vagina until your symptoms go away completely.  Get plenty of rest until your pain improves.  Drink clear fluids if you feel nauseous. Avoid solid food as long as you are uncomfortable or nauseous.  Only take over-the-counter or prescription medicine as directed by your health care provider.  Keep all follow-up appointments with your health care provider. SEEK IMMEDIATE MEDICAL CARE IF:  You are bleeding, leaking fluid, or passing tissue from the vagina.  You have increasing pain or cramping.  You have persistent vomiting.  You have painful or bloody urination.  You have a fever.  You notice a decrease in your baby's movements.  You have extreme weakness or feel faint.  You have shortness of breath, with or without abdominal pain.  You develop a severe headache with abdominal pain.  You have abnormal vaginal discharge with abdominal pain.  You have persistent diarrhea.  You have abdominal pain that continues even after rest, or gets worse. MAKE SURE YOU:   Understand these instructions.  Will watch your condition.  Will get help right away if you are not doing well or get worse.   This information is not intended to replace advice given to you by your health care provider. Make sure you discuss any questions you have with your health care provider.   Document Released: 03/18/2005 Document Revised: 01/06/2013 Document Reviewed:  10/15/2012 Elsevier Interactive Patient Education [employer].

## 2015-01-25 LAB — URINE CULTURE: Special Requests: NORMAL

## 2015-04-02 NOTE — L&D Delivery Note (Addendum)
Operative Delivery Note At 8:20 PM a viable and healthy female was delivered via Vaginal, Vacuum Neurosurgeon).  Presentation: vertex; Position: Right,, Occiput,, Posterior; Station: +1.  Verbal consent: obtained from patient.  Risks and benefits discussed in detail.  Risks include, but are not limited to the risks of anesthesia, bleeding, infection, damage to maternal tissues, fetal cephalhematoma.  There is also the risk of inability to effect vaginal delivery of the head, or shoulder dystocia that cannot be resolved by established maneuvers, leading to the need for emergency cesarean section.  APGAR: 7, 9; weight -pending Placenta status: Spontaneous complete but removed from lower segment. Pieces membranes removed manually.    Cord: 3 vessels with the following complications: None.  Cord pH: Arterial 7.14   Anesthesia: Epidural  Instruments: Mushroom, 2 contractions, pushed 4 times with 1st contraction, vacuum released in b/w, 2nd contraction pushed with 2 times. First downward direction f/by mid to up direction to complete head delivery. Vacuum released and the rest of the baby delivered without problems.  Episiotomy: None Lacerations: 1st degree;Vaginal and left Labial Suture Repair: 3.0 Vicryl rapide Est. Blood Loss (mL):  250 cc  Mom to postpartum.  Baby to Couplet care / Skin to Skin.  2 gm Ancef for manual exploration of uterus and removal of retained membranes  Cierria Height R 06/16/2015, 9:02 PM

## 2015-05-15 LAB — OB RESULTS CONSOLE GBS: STREP GROUP B AG: POSITIVE

## 2015-06-16 ENCOUNTER — Inpatient Hospital Stay (HOSPITAL_COMMUNITY): Payer: Federal, State, Local not specified - PPO | Admitting: Anesthesiology

## 2015-06-16 ENCOUNTER — Encounter (HOSPITAL_COMMUNITY): Payer: Self-pay

## 2015-06-16 ENCOUNTER — Inpatient Hospital Stay (HOSPITAL_COMMUNITY)
Admission: AD | Admit: 2015-06-16 | Discharge: 2015-06-16 | Disposition: A | Discharge status: Home / Self Care | Payer: Federal, State, Local not specified - PPO | Source: Ambulatory Visit | Attending: Obstetrics and Gynecology | Admitting: Obstetrics and Gynecology

## 2015-06-16 ENCOUNTER — Inpatient Hospital Stay (HOSPITAL_COMMUNITY)
Admission: AD | Admit: 2015-06-16 | Discharge: 2015-06-18 | DRG: 774 | Disposition: A | Discharge status: Home / Self Care | Payer: Federal, State, Local not specified - PPO | Source: Ambulatory Visit | Attending: Obstetrics & Gynecology | Admitting: Obstetrics & Gynecology

## 2015-06-16 ENCOUNTER — Encounter (HOSPITAL_COMMUNITY): Payer: Self-pay | Admitting: *Deleted

## 2015-06-16 DIAGNOSIS — O9832 Other infections with a predominantly sexual mode of transmission complicating childbirth: Secondary | ICD-10-CM | POA: Diagnosis present

## 2015-06-16 DIAGNOSIS — O480 Post-term pregnancy: Secondary | ICD-10-CM | POA: Diagnosis present

## 2015-06-16 DIAGNOSIS — D649 Anemia, unspecified: Secondary | ICD-10-CM | POA: Diagnosis present

## 2015-06-16 DIAGNOSIS — O99824 Streptococcus B carrier state complicating childbirth: Secondary | ICD-10-CM | POA: Diagnosis present

## 2015-06-16 DIAGNOSIS — Z3493 Encounter for supervision of normal pregnancy, unspecified, third trimester: Secondary | ICD-10-CM | POA: Insufficient documentation

## 2015-06-16 DIAGNOSIS — O9902 Anemia complicating childbirth: Secondary | ICD-10-CM | POA: Diagnosis present

## 2015-06-16 DIAGNOSIS — Z8759 Personal history of other complications of pregnancy, childbirth and the puerperium: Secondary | ICD-10-CM

## 2015-06-16 DIAGNOSIS — O4290 Premature rupture of membranes, unspecified as to length of time between rupture and onset of labor, unspecified weeks of gestation: Secondary | ICD-10-CM | POA: Diagnosis present

## 2015-06-16 DIAGNOSIS — Z87891 Personal history of nicotine dependence: Secondary | ICD-10-CM | POA: Diagnosis not present

## 2015-06-16 DIAGNOSIS — O4292 Full-term premature rupture of membranes, unspecified as to length of time between rupture and onset of labor: Secondary | ICD-10-CM | POA: Diagnosis present

## 2015-06-16 DIAGNOSIS — O3413 Maternal care for benign tumor of corpus uteri, third trimester: Secondary | ICD-10-CM | POA: Diagnosis present

## 2015-06-16 DIAGNOSIS — D259 Leiomyoma of uterus, unspecified: Secondary | ICD-10-CM | POA: Diagnosis present

## 2015-06-16 DIAGNOSIS — Z833 Family history of diabetes mellitus: Secondary | ICD-10-CM

## 2015-06-16 DIAGNOSIS — Z3A41 41 weeks gestation of pregnancy: Secondary | ICD-10-CM | POA: Diagnosis not present

## 2015-06-16 DIAGNOSIS — A6000 Herpesviral infection of urogenital system, unspecified: Secondary | ICD-10-CM | POA: Diagnosis present

## 2015-06-16 HISTORY — DX: Personal history of other complications of pregnancy, childbirth and the puerperium: Z87.59

## 2015-06-16 LAB — RPR: RPR: NONREACTIVE

## 2015-06-16 LAB — CBC
HEMATOCRIT: 33.5 % — AB (ref 36.0–46.0)
HEMOGLOBIN: 11.1 g/dL — AB (ref 12.0–15.0)
MCH: 29 pg (ref 26.0–34.0)
MCHC: 33.1 g/dL (ref 30.0–36.0)
MCV: 87.5 fL (ref 78.0–100.0)
Platelets: 253 10*3/uL (ref 150–400)
RBC: 3.83 MIL/uL — ABNORMAL LOW (ref 3.87–5.11)
RDW: 15 % (ref 11.5–15.5)
WBC: 7.6 10*3/uL (ref 4.0–10.5)

## 2015-06-16 LAB — TYPE AND SCREEN
ABO/RH(D): B POS
Antibody Screen: NEGATIVE

## 2015-06-16 LAB — ABO/RH: ABO/RH(D): B POS

## 2015-06-16 MED ORDER — LACTATED RINGERS IV SOLN: 500.0000 mL | Freq: Once | INTRAVENOUS | Status: DC

## 2015-06-16 MED ORDER — CEFAZOLIN SODIUM 1-5 GM-% IV SOLN
1.0000 g | Freq: Once | INTRAVENOUS | Status: AC
  Administered 2015-06-16: 1 g via INTRAVENOUS
  Filled 2015-06-16: qty 50

## 2015-06-16 MED ORDER — ONDANSETRON HCL 4 MG/2ML IJ SOLN: 4.0000 mg | INTRAMUSCULAR | Status: DC | PRN

## 2015-06-16 MED ORDER — FLEET ENEMA 7-19 GM/118ML RE ENEM: 1.0000 | ENEMA | RECTAL | Status: DC | PRN

## 2015-06-16 MED ORDER — LACTATED RINGERS IV SOLN
INTRAVENOUS | Status: DC
  Administered 2015-06-16: 125 mL/h via INTRAVENOUS
  Administered 2015-06-16 (×2): via INTRAVENOUS

## 2015-06-16 MED ORDER — CEFAZOLIN SODIUM 1-5 GM-% IV SOLN: 1.0000 g | Freq: Once | INTRAVENOUS | Status: DC

## 2015-06-16 MED ORDER — IBUPROFEN 600 MG PO TABS
600.0000 mg | ORAL_TABLET | Freq: Four times a day (QID) | ORAL | Status: DC
  Administered 2015-06-17 – 2015-06-18 (×7): 600 mg via ORAL
  Filled 2015-06-16 (×8): qty 1

## 2015-06-16 MED ORDER — ONDANSETRON HCL 4 MG PO TABS: 4.0000 mg | ORAL_TABLET | ORAL | Status: DC | PRN

## 2015-06-16 MED ORDER — OXYCODONE-ACETAMINOPHEN 5-325 MG PO TABS: 2.0000 | ORAL_TABLET | ORAL | Status: DC | PRN

## 2015-06-16 MED ORDER — ACETAMINOPHEN 325 MG PO TABS
650.0000 mg | ORAL_TABLET | ORAL | Status: DC | PRN
Start: 2015-06-16 — End: 2015-06-18

## 2015-06-16 MED ORDER — WITCH HAZEL-GLYCERIN EX PADS: 1.0000 "application " | MEDICATED_PAD | CUTANEOUS | Status: DC | PRN

## 2015-06-16 MED ORDER — OXYTOCIN BOLUS FROM INFUSION: 500.0000 mL | INTRAVENOUS | Status: DC

## 2015-06-16 MED ORDER — PRENATAL MULTIVITAMIN CH
1.0000 | ORAL_TABLET | Freq: Every day | ORAL | Status: DC
  Administered 2015-06-17 – 2015-06-18 (×2): 1 via ORAL
  Filled 2015-06-16 (×2): qty 1

## 2015-06-16 MED ORDER — ACETAMINOPHEN 325 MG PO TABS: 650.0000 mg | ORAL_TABLET | ORAL | Status: DC | PRN

## 2015-06-16 MED ORDER — BENZOCAINE-MENTHOL 20-0.5 % EX AERO
1.0000 "application " | INHALATION_SPRAY | CUTANEOUS | Status: DC | PRN
  Administered 2015-06-17: 1 via TOPICAL
  Filled 2015-06-16: qty 56

## 2015-06-16 MED ORDER — NALBUPHINE HCL 10 MG/ML IJ SOLN
5.0000 mg | Freq: Once | INTRAMUSCULAR | Status: AC
  Administered 2015-06-16: 5 mg via INTRAMUSCULAR
  Filled 2015-06-16: qty 1

## 2015-06-16 MED ORDER — DIBUCAINE 1 % RE OINT: 1.0000 "application " | TOPICAL_OINTMENT | RECTAL | Status: DC | PRN

## 2015-06-16 MED ORDER — DEXTROSE 5 % IV SOLN
5.0000 10*6.[IU] | Freq: Once | INTRAVENOUS | Status: AC
  Administered 2015-06-16: 5 10*6.[IU] via INTRAVENOUS
  Filled 2015-06-16: qty 5

## 2015-06-16 MED ORDER — LIDOCAINE HCL (PF) 1 % IJ SOLN
30.0000 mL | INTRAMUSCULAR | Status: DC | PRN
  Filled 2015-06-16: qty 30

## 2015-06-16 MED ORDER — CITRIC ACID-SODIUM CITRATE 334-500 MG/5ML PO SOLN
30.0000 mL | ORAL | Status: DC | PRN
  Administered 2015-06-16: 30 mL via ORAL
  Filled 2015-06-16: qty 15

## 2015-06-16 MED ORDER — TERBUTALINE SULFATE 1 MG/ML IJ SOLN
0.2500 mg | Freq: Once | INTRAMUSCULAR | Status: DC | PRN
  Filled 2015-06-16: qty 1

## 2015-06-16 MED ORDER — SIMETHICONE 80 MG PO CHEW: 80.0000 mg | CHEWABLE_TABLET | ORAL | Status: DC | PRN

## 2015-06-16 MED ORDER — TETANUS-DIPHTH-ACELL PERTUSSIS 5-2.5-18.5 LF-MCG/0.5 IM SUSP: 0.5000 mL | Freq: Once | INTRAMUSCULAR | Status: DC

## 2015-06-16 MED ORDER — LACTATED RINGERS IV SOLN: 1.0000 m[IU]/min | INTRAVENOUS | Status: DC

## 2015-06-16 MED ORDER — LIDOCAINE HCL (PF) 1 % IJ SOLN
INTRAMUSCULAR | Status: DC | PRN
  Administered 2015-06-16: 6 mL via EPIDURAL
  Administered 2015-06-16: 4 mL

## 2015-06-16 MED ORDER — SENNOSIDES-DOCUSATE SODIUM 8.6-50 MG PO TABS
2.0000 | ORAL_TABLET | ORAL | Status: DC
  Administered 2015-06-17 – 2015-06-18 (×2): 2 via ORAL
  Filled 2015-06-16 (×2): qty 2

## 2015-06-16 MED ORDER — DIPHENHYDRAMINE HCL 50 MG/ML IJ SOLN: 12.5000 mg | INTRAMUSCULAR | Status: DC | PRN

## 2015-06-16 MED ORDER — EPHEDRINE 5 MG/ML INJ
10.0000 mg | INTRAVENOUS | Status: DC | PRN
  Filled 2015-06-16: qty 2

## 2015-06-16 MED ORDER — LANOLIN HYDROUS EX OINT: TOPICAL_OINTMENT | CUTANEOUS | Status: DC | PRN

## 2015-06-16 MED ORDER — MISOPROSTOL 200 MCG PO TABS
ORAL_TABLET | ORAL | Status: AC
  Filled 2015-06-16: qty 4

## 2015-06-16 MED ORDER — PHENYLEPHRINE 40 MCG/ML (10ML) SYRINGE FOR IV PUSH (FOR BLOOD PRESSURE SUPPORT)
80.0000 ug | PREFILLED_SYRINGE | INTRAVENOUS | Status: DC | PRN
  Filled 2015-06-16: qty 2
  Filled 2015-06-16: qty 20

## 2015-06-16 MED ORDER — ZOLPIDEM TARTRATE 5 MG PO TABS: 5.0000 mg | ORAL_TABLET | Freq: Every evening | ORAL | Status: DC | PRN

## 2015-06-16 MED ORDER — ONDANSETRON HCL 4 MG/2ML IJ SOLN: 4.0000 mg | Freq: Four times a day (QID) | INTRAMUSCULAR | Status: DC | PRN

## 2015-06-16 MED ORDER — FENTANYL 2.5 MCG/ML BUPIVACAINE 1/10 % EPIDURAL INFUSION (WH - ANES)
14.0000 mL/h | INTRAMUSCULAR | Status: DC | PRN
  Administered 2015-06-16 (×2): 14 mL/h via EPIDURAL
  Filled 2015-06-16 (×2): qty 125

## 2015-06-16 MED ORDER — OXYCODONE-ACETAMINOPHEN 5-325 MG PO TABS: 1.0000 | ORAL_TABLET | ORAL | Status: DC | PRN

## 2015-06-16 MED ORDER — OXYTOCIN 10 UNIT/ML IJ SOLN
1.0000 m[IU]/min | INTRAVENOUS | Status: DC
  Administered 2015-06-16: 2 m[IU]/min via INTRAVENOUS
  Filled 2015-06-16: qty 10

## 2015-06-16 MED ORDER — LACTATED RINGERS IV SOLN: 500.0000 mL | INTRAVENOUS | Status: DC | PRN

## 2015-06-16 MED ORDER — PENICILLIN G POTASSIUM 5000000 UNITS IJ SOLR
2.5000 10*6.[IU] | INTRAVENOUS | Status: DC
  Administered 2015-06-16 (×3): 2.5 10*6.[IU] via INTRAVENOUS
  Filled 2015-06-16 (×4): qty 2.5

## 2015-06-16 MED ORDER — DIPHENHYDRAMINE HCL 25 MG PO CAPS: 25.0000 mg | ORAL_CAPSULE | Freq: Four times a day (QID) | ORAL | Status: DC | PRN

## 2015-06-16 MED ORDER — LACTATED RINGERS IV SOLN: 2.5000 [IU]/h | INTRAVENOUS | Status: DC

## 2015-06-16 MED ORDER — PHENYLEPHRINE 40 MCG/ML (10ML) SYRINGE FOR IV PUSH (FOR BLOOD PRESSURE SUPPORT)
80.0000 ug | PREFILLED_SYRINGE | INTRAVENOUS | Status: AC | PRN
  Administered 2015-06-16 (×3): 80 ug via INTRAVENOUS

## 2015-06-16 MED ORDER — IBUPROFEN 600 MG PO TABS: 600.0000 mg | ORAL_TABLET | Freq: Four times a day (QID) | ORAL | Status: DC | PRN

## 2015-06-16 NOTE — Anesthesia Procedure Notes (Signed)

## 2015-06-16 NOTE — Discharge Instructions (Signed)
Fetal Movement Counts °Patient Name: __________________________________________________ Patient Due Date: ____________________ °Performing a fetal movement count is highly recommended in high-risk pregnancies, but it is good for every pregnant woman to do. Your health care provider may ask you to start counting fetal movements at 28 weeks of the pregnancy. Fetal movements often increase: °· After eating a full meal. °· After physical activity. °· After eating or drinking something sweet or cold. °· At rest. °Pay attention to when you feel the baby is most active. This will help you notice a pattern of your baby's sleep and wake cycles and what factors contribute to an increase in fetal movement. It is important to perform a fetal movement count at the same time each day when your baby is normally most active.  °HOW TO COUNT FETAL MOVEMENTS °1. Find a quiet and comfortable area to sit or lie down on your left side. Lying on your left side provides the best blood and oxygen circulation to your baby. °2. Write down the day and time on a sheet of paper or in a journal. °3. Start counting kicks, flutters, swishes, rolls, or jabs in a 2-hour period. You should feel at least 10 movements within 2 hours. °4. If you do not feel 10 movements in 2 hours, wait 2-3 hours and count again. Look for a change in the pattern or not enough counts in 2 hours. °SEEK MEDICAL CARE IF: °· You feel less than 10 counts in 2 hours, tried twice. °· There is no movement in over an hour. °· The pattern is changing or taking longer each day to reach 10 counts in 2 hours. °· You feel the baby is not moving as he or she usually does. °Date: ____________ Movements: ____________ Start time: ____________ Finish time: ____________  °Date: ____________ Movements: ____________ Start time: ____________ Finish time: ____________ °Date: ____________ Movements: ____________ Start time: ____________ Finish time: ____________ °Date: ____________ Movements:  ____________ Start time: ____________ Finish time: ____________ °Date: ____________ Movements: ____________ Start time: ____________ Finish time: ____________ °Date: ____________ Movements: ____________ Start time: ____________ Finish time: ____________ °Date: ____________ Movements: ____________ Start time: ____________ Finish time: ____________ °Date: ____________ Movements: ____________ Start time: ____________ Finish time: ____________  °Date: ____________ Movements: ____________ Start time: ____________ Finish time: ____________ °Date: ____________ Movements: ____________ Start time: ____________ Finish time: ____________ °Date: ____________ Movements: ____________ Start time: ____________ Finish time: ____________ °Date: ____________ Movements: ____________ Start time: ____________ Finish time: ____________ °Date: ____________ Movements: ____________ Start time: ____________ Finish time: ____________ °Date: ____________ Movements: ____________ Start time: ____________ Finish time: ____________ °Date: ____________ Movements: ____________ Start time: ____________ Finish time: ____________  °Date: ____________ Movements: ____________ Start time: ____________ Finish time: ____________ °Date: ____________ Movements: ____________ Start time: ____________ Finish time: ____________ °Date: ____________ Movements: ____________ Start time: ____________ Finish time: ____________ °Date: ____________ Movements: ____________ Start time: ____________ Finish time: ____________ °Date: ____________ Movements: ____________ Start time: ____________ Finish time: ____________ °Date: ____________ Movements: ____________ Start time: ____________ Finish time: ____________ °Date: ____________ Movements: ____________ Start time: ____________ Finish time: ____________  °Date: ____________ Movements: ____________ Start time: ____________ Finish time: ____________ °Date: ____________ Movements: ____________ Start time: ____________ Finish  time: ____________ °Date: ____________ Movements: ____________ Start time: ____________ Finish time: ____________ °Date: ____________ Movements: ____________ Start time: ____________ Finish time: ____________ °Date: ____________ Movements: ____________ Start time: ____________ Finish time: ____________ °Date: ____________ Movements: ____________ Start time: ____________ Finish time: ____________ °Date: ____________ Movements: ____________ Start time: ____________ Finish time: ____________  °Date: ____________ Movements: ____________ Start time: ____________ Finish   time: ____________ Date: ____________ Movements: ____________ Start time: ____________ Elizebeth Koller time: ____________ Date: ____________ Movements: ____________ Start time: ____________ Elizebeth Koller time: ____________ Date: ____________ Movements: ____________ Start time: ____________ Elizebeth Koller time: ____________ Date: ____________ Movements: ____________ Start time: ____________ Elizebeth Koller time: ____________ Date: ____________ Movements: ____________ Start time: ____________ Elizebeth Koller time: ____________ Date: ____________ Movements: ____________ Start time: ____________ Elizebeth Koller time: ____________  Date: ____________ Movements: ____________ Start time: ____________ Elizebeth Koller time: ____________ Date: ____________ Movements: ____________ Start time: ____________ Elizebeth Koller time: ____________ Date: ____________ Movements: ____________ Start time: ____________ Elizebeth Koller time: ____________ Date: ____________ Movements: ____________ Start time: ____________ Elizebeth Koller time: ____________ Date: ____________ Movements: ____________ Start time: ____________ Elizebeth Koller time: ____________ Date: ____________ Movements: ____________ Start time: ____________ Elizebeth Koller time: ____________ Date: ____________ Movements: ____________ Start time: ____________ Elizebeth Koller time: ____________  Date: ____________ Movements: ____________ Start time: ____________ Elizebeth Koller time: ____________ Date: ____________  Movements: ____________ Start time: ____________ Elizebeth Koller time: ____________ Date: ____________ Movements: ____________ Start time: ____________ Elizebeth Koller time: ____________ Date: ____________ Movements: ____________ Start time: ____________ Elizebeth Koller time: ____________ Date: ____________ Movements: ____________ Start time: ____________ Elizebeth Koller time: ____________ Date: ____________ Movements: ____________ Start time: ____________ Elizebeth Koller time: ____________ Date: ____________ Movements: ____________ Start time: ____________ Elizebeth Koller time: ____________  Date: ____________ Movements: ____________ Start time: ____________ Elizebeth Koller time: ____________ Date: ____________ Movements: ____________ Start time: ____________ Elizebeth Koller time: ____________ Date: ____________ Movements: ____________ Start time: ____________ Elizebeth Koller time: ____________ Date: ____________ Movements: ____________ Start time: ____________ Elizebeth Koller time: ____________ Date: ____________ Movements: ____________ Start time: ____________ Elizebeth Koller time: ____________ Date: ____________ Movements: ____________ Start time: ____________ Elizebeth Koller time: ____________   This information is not intended to replace advice given to you by your health care provider. Make sure you discuss any questions you have with your health care provider.   Document Released: 04/17/2006 Document Revised: 04/08/2014 Document Reviewed: 01/13/2012 Elsevier Interactive Patient Education 2016 [place] of Pregnancy The third trimester is from week 29 through week 42, months 7 through 9. The third trimester is a time when the fetus is growing rapidly. At the end of the ninth month, the fetus is about 20 inches in length and weighs 6-10 pounds.  BODY CHANGES Your body goes through many changes during pregnancy. The changes vary from woman to woman.   Your weight will continue to increase. You can expect to gain 25-35 pounds (11-16 kg) by the end of the pregnancy.  You may  begin to get stretch marks on your hips, abdomen, and breasts.  You may urinate more often because the fetus is moving lower into your pelvis and pressing on your bladder.  You may develop or continue to have heartburn as a result of your pregnancy.  You may develop constipation because certain hormones are causing the muscles that push waste through your intestines to slow down.  You may develop hemorrhoids or swollen, bulging veins (varicose veins).  You may have pelvic pain because of the weight gain and pregnancy hormones relaxing your joints between the bones in your pelvis. Backaches may result from overexertion of the muscles supporting your posture.  You may have changes in your hair. These can include thickening of your hair, rapid growth, and changes in texture. Some women also have hair loss during or after pregnancy, or hair that feels dry or thin. Your hair will most likely return to normal after your baby is born.  Your breasts will continue to grow and be tender. A yellow discharge may leak from your breasts called colostrum.  Your belly button may stick out.  You may feel short of breath because of your expanding uterus.  You may notice the fetus "dropping," or moving lower in your abdomen.  You may have a bloody mucus discharge. This usually occurs a few days to a week before labor begins.  Your cervix becomes thin and soft (effaced) near your due date. WHAT TO EXPECT AT YOUR PRENATAL EXAMS  You will have prenatal exams every 2 weeks until week 36. Then, you will have weekly prenatal exams. During a routine prenatal visit: 5. You will be weighed to make sure you and the fetus are growing normally. 6. Your blood pressure is taken. 7. Your abdomen will be measured to track your baby's growth. 8. The fetal heartbeat will be listened to. 9. Any test results from the previous visit will be discussed. 10. You may have a cervical check near your due date to see if you have  effaced. At around 36 weeks, your caregiver will check your cervix. At the same time, your caregiver will also perform a test on the secretions of the vaginal tissue. This test is to determine if a type of bacteria, Group B streptococcus, is present. Your caregiver will explain this further. Your caregiver may ask you:  What your birth plan is.  How you are feeling.  If you are feeling the baby move.  If you have had any abnormal symptoms, such as leaking fluid, bleeding, severe headaches, or abdominal cramping.  If you are using any tobacco products, including cigarettes, chewing tobacco, and electronic cigarettes.  If you have any questions. Other tests or screenings that may be performed during your third trimester include:  Blood tests that check for low iron levels (anemia).  Fetal testing to check the health, activity level, and growth of the fetus. Testing is done if you have certain medical conditions or if there are problems during the pregnancy.  HIV (human immunodeficiency virus) testing. If you are at high risk, you may be screened for HIV during your third trimester of pregnancy. FALSE LABOR You may feel small, irregular contractions that eventually go away. These are called Braxton Hicks contractions, or false labor. Contractions may last for hours, days, or even weeks before true labor sets in. If contractions come at regular intervals, intensify, or become painful, it is best to be seen by your caregiver.  SIGNS OF LABOR   Menstrual-like cramps.  Contractions that are 5 minutes apart or less.  Contractions that start on the top of the uterus and spread down to the lower abdomen and back.  A sense of increased pelvic pressure or back pain.  A watery or bloody mucus discharge that comes from the vagina. If you have any of these signs before the 37th week of pregnancy, call your caregiver right away. You need to go to the hospital to get checked immediately. HOME CARE  INSTRUCTIONS   Avoid all smoking, herbs, alcohol, and unprescribed drugs. These chemicals affect the formation and growth of the baby.  Do not use any tobacco products, including cigarettes, chewing tobacco, and electronic cigarettes. If you need help quitting, ask your health care provider. You may receive counseling support and other resources to help you quit.  Follow your caregiver's instructions regarding medicine use. There are medicines that are either safe or unsafe to take during pregnancy.  Exercise only as directed by your caregiver. Experiencing uterine cramps is a good sign to stop exercising.  Continue to eat regular, healthy meals.  Wear a good support bra for  breast tenderness.  Do not use hot tubs, steam rooms, or saunas.  Wear your seat belt at all times when driving.  Avoid raw meat, uncooked cheese, cat litter boxes, and soil used by cats. These carry germs that can cause birth defects in the baby.  Take your prenatal vitamins.  Take 1500-2000 mg of calcium daily starting at the 20th week of pregnancy until you deliver your baby.  Try taking a stool softener (if your caregiver approves) if you develop constipation. Eat more high-fiber foods, such as fresh vegetables or fruit and whole grains. Drink plenty of fluids to keep your urine clear or pale yellow.  Take warm sitz baths to soothe any pain or discomfort caused by hemorrhoids. Use hemorrhoid cream if your caregiver approves.  If you develop varicose veins, wear support hose. Elevate your feet for 15 minutes, 3-4 times a day. Limit salt in your diet.  Avoid heavy lifting, wear low heal shoes, and practice good posture.  Rest a lot with your legs elevated if you have leg cramps or low back pain.  Visit your dentist if you have not gone during your pregnancy. Use a soft toothbrush to brush your teeth and be gentle when you floss.  A sexual relationship may be continued unless your caregiver directs you  otherwise.  Do not travel far distances unless it is absolutely necessary and only with the approval of your caregiver.  Take prenatal classes to understand, practice, and ask questions about the labor and delivery.  Make a trial run to the hospital.  Pack your hospital bag.  Prepare the baby's nursery.  Continue to go to all your prenatal visits as directed by your caregiver. SEEK MEDICAL CARE IF:  You are unsure if you are in labor or if your water has broken.  You have dizziness.  You have mild pelvic cramps, pelvic pressure, or nagging pain in your abdominal area.  You have persistent nausea, vomiting, or diarrhea.  You have a bad smelling vaginal discharge.  You have pain with urination. SEEK IMMEDIATE MEDICAL CARE IF:   You have a fever.  You are leaking fluid from your vagina.  You have spotting or bleeding from your vagina.  You have severe abdominal cramping or pain.  You have rapid weight loss or gain.  You have shortness of breath with chest pain.  You notice sudden or extreme swelling of your face, hands, ankles, feet, or legs.  You have not felt your baby move in over an hour.  You have severe headaches that do not go away with medicine.  You have vision changes.   This information is not intended to replace advice given to you by your health care provider. Make sure you discuss any questions you have with your health care provider.   Document Released: 03/12/2001 Document Revised: 04/08/2014 Document Reviewed: 05/19/2012 Elsevier Interactive Patient Education [employer].

## 2015-06-16 NOTE — Progress Notes (Signed)
Patient ID: Maureen Schwartz, female   DOB: December 14, 1975, 40 y.o.   MRN: [id number] Subjective: Doing well. Complete at 4.22 pm. Pushed for 1 hr from 5 to 6 pm, no descent noted, vtx OP at 0 to +1. Mid-pelvis convergent walls? Outlet adequate  Objective: BP 123/73 mmHg  Pulse 81  Temp(Src) 97.8 F (36.6 C) (Oral)  Resp 16  Ht 5\' 5"  (1.651 m)  Wt 210 lb (95.255 kg)  BMI 34.95 kg/m2  SpO2 97%  LMP 08/30/2014 (Exact Date) FHT:  FHR: 120s bpm, variability: moderate,  accelerations:  Present,  decelerations:  Present variable decels with UCs. overall still I with intermittent II. UC:   regular, every 3 minutes SVE:   Dilation: 10 Effacement (%): 100 Station: +1 Exam by:: Maureen Schwartz  No descent since 1 hr of pushing.  Place in lateral position with peanut ball and reassess in 30-40 min for rotation and descent and if noted, plan to push again.   Assessment / Plan:  Anticipated MOD:  guarded  Maureen Schwartz 06/16/2015, 6:10 PM

## 2015-06-16 NOTE — H&P (Signed)
SHIRLYN SUTLIFF is a 40 y.o. female G43P0010 [redacted]w[redacted]d presenting for SROM with UCs.  HPP/HPI:  AF leak x this am, tinted per nurse at MAU.  UCs increasing, not regular.  FMs present.  No vaginal bleeding.  No PEC Sx.  No chills.  OB History    Gravida Para Term Preterm AB TAB SAB Ectopic Multiple Living   3    1 1     0     Past Medical History  Diagnosis Date  . Fibroid   . Headache   . Anemia   . Herpes     last outbreak July 2016   Past Surgical History  Procedure Laterality Date  . Therapeutic abortion      cyst removed from urethra   Family History: family history includes Cancer in her maternal uncle; Diabetes in her maternal aunt; Kidney disease in her maternal aunt. Social History:  reports that she quit smoking about 9 years ago. She does not have any smokeless tobacco history on file. She reports that she does not drink alcohol or use illicit drugs.  No Known Allergies  Dilation: 3 Effacement (%): 90 Station: -2 Exam by:: k fields, rn  AF tinted meco, fluid.  Blood pressure 109/63, pulse 102, temperature 98 F (36.7 C), temperature source Oral, resp. rate 16, height 5\' 5"  (1.651 m), weight 210 lb (95.255 kg), last menstrual period 08/30/2014, SpO2 98 %. Exam Physical Exam   FHR 120's with good variability and accelerations, no deceleration. UCs irregular at admission  HPP:  Patient Active Problem List   Diagnosis Date Noted  . ROM (rupture of membranes), premature 06/16/2015    Prenatal labs: ABO, Rh: --/--/B POS (03/17 0843) Antibody: NEG (03/17 0843) Rubella: Immune RPR: Nonreactive (08/01 0000)  HBsAg: Negative (08/01 0000)  HIV: Non-reactive (08/01 0000)  Genetic testing: Ultrascreen wnl.  AFP1 neg. [place] anato: wnl 1 hr GTT: wnl GBS: Positive (02/13 0000)   Assessment/Plan: G3P0A2 41 1/7 wks with SROM tinted meco.  FHR monitoring Cat 1.  Not in active labor, Pitocin induction.  GBS pos, no allergy, Pen G protocole.  Monitoring.  Epidural.  Expectant  towards probable vaginal delivery.     Pearla Mckinny,MARIE-LYNE 06/16/2015, 11:02 AM

## 2015-06-16 NOTE — Progress Notes (Signed)
Subjective: Doing well, pain controled, UCs q3-4 min  Anesthesia epidural   Objective: BP 119/72 mmHg  Pulse 78  Temp(Src) 98 F (36.7 C) (Oral)  Resp 16  Ht 5\' 5"  (1.651 m)  Wt 210 lb (95.255 kg)  BMI 34.95 kg/m2  SpO2 97%  LMP 08/30/2014 (Exact Date)   FHT:  FHR: 130's with good variability, reactive to scalp stimulation.  Mild occasional variable decelerations. bpm, variability: moderate,  accelerations:  Present,  decelerations:  Present Mild variable decelerations occasionally. UC:   regular, every 3-4 minutes VE:   Dilation: 4 Effacement (%): 100 Station: -1 Exam by:: Buster Schueller  Tinted meco   Assessment / Plan: Induction of labor due to PROM and postterm,  progressing well on pitocin  Fetal Wellbeing:  Category I Pain Control:  Epidural  Anticipated MOD:  NSVD  Tamura Lasky,MARIE-LYNE 06/16/2015, 12:34 PM

## 2015-06-16 NOTE — Progress Notes (Signed)
Patient ID: Maureen Schwartz, female   DOB: Feb 24, 1976, 40 y.o.   MRN: [id number] Station +1 and +2 with pushing. OP/LOT, started pushing again in left lateral monitoring  FHT category I

## 2015-06-16 NOTE — Anesthesia Preprocedure Evaluation (Signed)

## 2015-06-16 NOTE — MAU Note (Signed)
Pt reports ROM at 0530, contractions.

## 2015-06-16 NOTE — MAU Note (Signed)
Pt reports consistent and severe contractions this pm.

## 2015-06-17 ENCOUNTER — Encounter (HOSPITAL_COMMUNITY): Payer: Self-pay | Admitting: *Deleted

## 2015-06-17 LAB — CBC
HCT: 27.3 % — ABNORMAL LOW (ref 36.0–46.0)
HEMOGLOBIN: 9.2 g/dL — AB (ref 12.0–15.0)
MCH: 29.2 pg (ref 26.0–34.0)
MCHC: 33.7 g/dL (ref 30.0–36.0)
MCV: 86.7 fL (ref 78.0–100.0)
Platelets: 210 10*3/uL (ref 150–400)
RBC: 3.15 MIL/uL — AB (ref 3.87–5.11)
RDW: 15 % (ref 11.5–15.5)
WBC: 14.3 10*3/uL — AB (ref 4.0–10.5)

## 2015-06-17 NOTE — Lactation Note (Signed)
This note was copied from a baby's chart. Lactation Consultation Note follow up visit at 22 hours of age.  Baby has not been showing feeding cues since delivery and has not had a feedings since delivery.  Mom is eager to hold baby STS and try.  Baby does not suck well on gloved finger.  Attempted latch and baby does not suck.  Mom has compressible breast tissue and baby will open mouth, but does not suckle.  Laid back hold and football holds attempted.  LC hand expressed and placed drops to baby's mouth.  LC then hand expressed and spoon fed about 63mls.  Baby more awake, but not cueing.   Mom holding baby STS and plans to pump after this attempt.  Encouraged mom to offer spoon feedings, but will contact RN if baby is not feeding better soon.    Patient Name: Maureen Schwartz [id number] Date: 06/17/2015 Reason for consult: Follow-up assessment   Maternal Data Has patient been taught Hand Expression?: Yes  Feeding Feeding Type: Breast Fed  LATCH Score/Interventions Latch: Too sleepy or reluctant, no latch achieved, no sucking elicited. Intervention(s): Skin to skin;Teach feeding cues;Waking techniques  Audible Swallowing: None  Type of Nipple: Everted at rest and after stimulation  Comfort (Breast/Nipple): Soft / non-tender     Hold (Positioning): Assistance needed to correctly position infant at breast and maintain latch. Intervention(s): Breastfeeding basics reviewed;Support Pillows;Position options;Skin to skin  LATCH Score: 5  Lactation Tools Discussed/Used     Consult Status Consult Status: Follow-up Date: 06/18/15 Follow-up type: In-patient    Justice Britain 06/17/2015, 6:56 PM

## 2015-06-17 NOTE — Lactation Note (Signed)
This note was copied from a baby's chart. Lactation Consultation Note  P1, Taught mother to hand express and she expressed drops bilaterally. Attempted latching in football hold but baby sleepy.  Encouraged STS after bath and suggest mother retry. Mom encouraged to feed baby 8-12 times/24 hours and with feeding cues.  Mom made aware of O/P services, breastfeeding support groups, community resources, and our phone # for post-discharge questions.  Discussed cluster feeding and basics.  Suggest mother post pump w/ DEBP every 3 hours if baby is not latching for 10-15 min. Butch Penny RN reviewed pumping.  Patient Name: Maureen Schwartz [id number] Date: 06/17/2015 Reason for consult: Initial assessment   Maternal Data Has patient been taught Hand Expression?: Yes Does the patient have breastfeeding experience prior to this delivery?: No  Feeding Feeding Type: Breast Fed Length of feed: 0 min  LATCH Score/Interventions Latch: Too sleepy or reluctant, no latch achieved, no sucking elicited. Intervention(s): Skin to skin;Waking techniques  Audible Swallowing: None  Type of Nipple: Everted at rest and after stimulation  Comfort (Breast/Nipple): Soft / non-tender     Hold (Positioning): Assistance needed to correctly position infant at breast and maintain latch.  LATCH Score: 5  Lactation Tools Discussed/Used Pump Review: Setup, frequency, and cleaning Initiated by:: Ernst Breach, Rn  Date initiated:: 06/17/15   Consult Status Consult Status: Follow-up Date: 06/18/15 Follow-up type: In-patient    Vivianne Master [facility] 06/17/2015, 1:38 PM

## 2015-06-17 NOTE — Anesthesia Postprocedure Evaluation (Signed)
Anesthesia Post Note  Patient: Maureen Schwartz  Procedure(s) Performed: * No procedures listed *  Patient location during evaluation: Mother Baby Anesthesia Type: Epidural Level of consciousness: awake and alert and oriented Pain management: pain level controlled Vital Signs Assessment: post-procedure vital signs reviewed and stable Respiratory status: spontaneous breathing and nonlabored ventilation Cardiovascular status: blood pressure returned to baseline Postop Assessment: no headache, no backache, patient able to bend at knees, no signs of nausea or vomiting and adequate PO intake Anesthetic complications: no Comments: Pain level: none Patient up ambulating    Last Vitals:  Filed Vitals:   06/16/15 2355 06/17/15 0419  BP: 134/72 120/75  Pulse: 105 97  Temp: 37.2 C 37.1 C  Resp: 20 20    Last Pain:  Filed Vitals:   06/17/15 0913  PainSc: Asleep                 Kate Larock

## 2015-06-18 MED ORDER — ACETAMINOPHEN 325 MG PO TABS: 650.0000 mg | ORAL_TABLET | ORAL | Status: DC | PRN

## 2015-06-18 MED ORDER — IBUPROFEN 600 MG PO TABS: 600.0000 mg | ORAL_TABLET | Freq: Four times a day (QID) | ORAL | Status: DC | PRN

## 2015-06-18 MED ORDER — FERROUS SULFATE 325 (65 FE) MG PO TABS: 325.0000 mg | ORAL_TABLET | Freq: Every day | ORAL | Status: DC

## 2015-06-18 NOTE — Progress Notes (Signed)
Patient ID: Zoe Lan, female   DOB: 1975-08-20, 40 y.o.   MRN: [id number] Post Partum Day #2  S/P VAVD        Information for the patient's newborn:  Khanya, [place] Girl Siane [id number]  female   Feeding: breast  Subjective: No HA, SOB, CP, F/C, breast symptoms. Pain well-controlled with ibuprofen. Normal vaginal bleeding, no clots.      Objective: BP 125/74 mmHg  Pulse 92  Temp(Src) 98.4 F (36.9 C) (Oral)  Resp 18  Ht 5\' 5"  (1.651 m)  Wt 210 lb (95.255 kg)  BMI 34.95 kg/m2  SpO2 98%  LMP 08/30/2014 (Exact Date)  Breastfeeding? Unknown BP range reviewed, worse 144/77, no symptoms  Physical Exam:  General: alert, cooperative, fatigued and no distress Uterine Fundus: firm, midline. Large fibroid at the fundus, but can feel normal uterus on the left of umbilicus and is firm Lochia: appropriate Perineum: intact. 1st degree vaginal lac, no pain from that.  DVT Evaluation: No evidence of DVT seen on physical exam.  Blood type: --/--/B POS (03/17 0843) Rubella: Immune (08/01 0000)  Hgb 11.1 --> 9.2 pp  Assessment/Plan: PPD # 2 / 40 y.o., G3P1011 S/P: vacuum extraction. [facility], doing well.    Principal Problem:   Status post vacuum-assisted vaginal delivery (3/17) Active Problems:   ROM (rupture of membranes), premature   Vacuum extractor delivery, delivered   normal postpartum exam  Continue current postpartum care  D/C home PP care reviewed, warning s/s, refer to Booklet, reviewed PP depression s/s. Has great support.  Anemia- Iron x 1 months, cont PNVit.    LOS: 2 days    V.Benjie Karvonen, MD

## 2015-06-18 NOTE — Discharge Summary (Signed)
OB Discharge Summary     Patient Name: Maureen Schwartz DOB: Nov 20, 1975 MRN: [id number]  Date of admission: 06/16/2015 Delivering MD: Cade Dashner   Date of discharge: 06/18/2015  Admitting diagnosis: 41 WEEKS ROM Intrauterine pregnancy: [redacted]w[redacted]d     Secondary diagnosis:  Principal Problem:   Status post vacuum-assisted vaginal delivery (3/17) Active Problems:   ROM (rupture of membranes), premature   Vacuum extractor delivery, delivered  Additional problems: Large uterine fibroids, Anemia      Discharge diagnosis: Term Pregnancy Delivered and Anemia                                                                                                Post partum procedures:None   Augmentation: Pitocin  Complications: None except manual exploration of uterus to remove pieces of membranes, so 2 gm Ancef given post-delivery  Hospital course:  Onset of Labor With Vaginal Delivery     40 y.o. yo G3P1011 at [redacted]w[redacted]d was admitted in Latent Labor on 06/16/2015 with SROM Patient had an uncomplicated labor course as follows:  Intrapartum Procedures: Vacuum assisted vaginal delivery,                                            Lacerations:  1st degree [2];Vaginal [6];Labial [10]  Patient had a delivery of a Viable infant. 06/16/2015  Information for the patient's newborn:  Maureen, Schwartz [id number]  Delivery Method: Vaginal, Vacuum (Extractor) (Filed from Delivery Summary)    Pateint had an uncomplicated postpartum course.  She is ambulating, tolerating a regular diet, passing flatus, and urinating well. Patient is discharged home in stable condition on 06/18/2015.    Physical exam  Filed Vitals:   06/17/15 0419 06/17/15 1249 06/17/15 1759 06/18/15 0540  BP: 120/75 122/74 144/77 125/74  Pulse: 97 86 98 92  Temp: 98.7 F (37.1 C) 98.6 F (37 C) 98.5 F (36.9 C) 98.4 F (36.9 C)  TempSrc: Oral Oral Oral   Resp: 20 22 18 18   Height:      Weight:      SpO2:  100% 98%    General:  alert, cooperative and no distress Lochia: appropriate Uterine Fundus: firm Incision: N/A DVT Evaluation: No evidence of DVT seen on physical exam. Labs: Lab Results  Component Value Date   WBC 14.3* 06/17/2015   HGB 9.2* 06/17/2015   HCT 27.3* 06/17/2015   MCV 86.7 06/17/2015   PLT 210 06/17/2015   CMP Latest Ref Rng 06/12/2010  Glucose 70 - 99 mg/dL 92  BUN 6 - 23 mg/dL 9  Creatinine 0.4 - 1.2 mg/dL .8  Sodium 135 - 145 mEq/L 139  Potassium 3.5 - 5.1 mEq/L 3.5  Chloride 96 - 112 mEq/L 106  CO2 19 - 32 mEq/L 25  Calcium 8.4 - 10.5 mg/dL 8.9  Total Protein 6.0 - 8.3 g/dL 7.7  Total Bilirubin 0.3 - 1.2 mg/dL 0.9  Alkaline Phos 39 - 117 U/L 66  AST 0 - 37 U/L 20  ALT 0 - 35 U/L 21    Discharge instruction: per After Visit Summary and "Baby and Me Booklet".  After visit meds:    Medication List    STOP taking these medications        HYDROmorphone 2 MG tablet  Commonly known as:  DILAUDID      TAKE these medications        acetaminophen 325 MG tablet  Commonly known as:  TYLENOL  Take 2 tablets (650 mg total) by mouth every 4 (four) hours as needed (for pain scale < 4ORtemperature>/=100.5 F).     ferrous sulfate 325 (65 FE) MG tablet  Commonly known as:  FERROUSUL  Take 1 tablet (325 mg total) by mouth daily with breakfast.     ibuprofen 600 MG tablet  Commonly known as:  ADVIL,MOTRIN  Take 1 tablet (600 mg total) by mouth every 6 (six) hours as needed for cramping.     ipratropium 0.06 % nasal spray  Commonly known as:  ATROVENT  Place 2 sprays into both nostrils 4 (four) times daily.     multivitamin-prenatal 27-0.8 MG Tabs tablet  Take 1 tablet by mouth daily at 12 noon.        Diet: routine diet  Activity: Advance as tolerated. Pelvic rest for 6 weeks.   Outpatient follow up:6 weeks Follow up Appt:No future appointments. Follow up Visit:No Follow-up on file.  Postpartum contraception: Not Discussed  Newborn Data: Live born female -  Vacuum assisted due to ling second stage and repetitive variable decels  Birth Weight: 7 lb 4.6 oz (3305 g) APGAR: 7, 9  Baby Feeding: Breast Disposition:home with mother   06/18/2015 [name], MD

## 2015-06-18 NOTE — Lactation Note (Signed)
This note was copied from a baby's chart. Lactation Consultation Note  Assisted w/ latching baby on R side in football hold and demonstrated how to achieve a deep latch. Baby latched for approx 10 min and came off.  Attempted latching in laid back position on both L and R sides. Applied #24NS, mother prefilled w/ formula and sucks and swallows observed. Reviewed volume guidelines and encouraged mother to post pump 4-6x a day for 10-15 min and give baby back volume pumped.   Patient Name: Maureen Schwartz [id number] Date: 06/18/2015 Reason for consult: Follow-up assessment   Maternal Data    Feeding Feeding Type: Breast Fed Length of feed: 5 min  LATCH Score/Interventions Latch: Repeated attempts needed to sustain latch, nipple held in mouth throughout feeding, stimulation needed to elicit sucking reflex. Intervention(s): Skin to skin;Waking techniques  Audible Swallowing: A few with stimulation Intervention(s): Skin to skin;Hand expression  Type of Nipple: Everted at rest and after stimulation  Comfort (Breast/Nipple): Soft / non-tender     Hold (Positioning): Assistance needed to correctly position infant at breast and maintain latch.  LATCH Score: 7  Lactation Tools Discussed/Used Tools: Nipple Shields Nipple shield size: 24   Consult Status Consult Status: Follow-up Date: 06/19/15 Follow-up type: In-patient    Vivianne Master [facility] 06/18/2015, 9:47 AM

## 2015-06-18 NOTE — Discharge Instructions (Signed)

## 2015-06-18 NOTE — Lactation Note (Signed)
This note was copied from a baby's chart. Lactation Consultation NoteFollow up visit at 26 hours of age.  [place] fit mom with #20 NS and #24 appears to fit better.  Unsure if baby will tolerate #24 mom to try first.  Baby is asleep in crib.  Rn to weigh and assess baby soon and will assist with latch attempt.  Mom to pump q3 with hand expression and offer EBM to baby.  Mom is able to apply NS with instructions given.  Mom aware of need to follow up o/p if she continues to need to use NS.  Report given to RN.   Patient Name: Girl Zailey Yearout [id number] Date: 06/18/2015     Maternal Data    Feeding    LATCH Score/Interventions                      Lactation Tools Discussed/Used     Consult Status      Shoptaw, Justine Null 06/18/2015, 12:13 AM

## 2015-06-18 NOTE — Progress Notes (Signed)
Patient ID: Maureen Schwartz, female   DOB: Jul 23, 1975, 40 y.o.   MRN: [id number] PPD # 1 SVD  S:  Reports feeling well             Tolerating po/ No nausea or vomiting             Bleeding is light             Pain controlled with ibuprofen (OTC)             Up ad lib / ambulatory / voiding without difficulties    Newborn  Information for the patient's newborn:  [name], [name] [id number]  female  breast feeding  \   O:  A & O x 3, in no apparent distress              VS:  Filed Vitals:   06/17/15 0419 06/17/15 1249 06/17/15 1759 06/18/15 0540  BP: 120/75 122/74 144/77 125/74  Pulse: 97 86 98 92  Temp: 98.7 F (37.1 C) 98.6 F (37 C) 98.5 F (36.9 C) 98.4 F (36.9 C)  TempSrc: Oral Oral Oral   Resp: 20 22 18 18   Height:      Weight:      SpO2:  100% 98%     LABS:  Recent Labs  06/16/15 0844 06/17/15 0622  WBC 7.6 14.3*  HGB 11.1* 9.2*  HCT 33.5* 27.3*  PLT 253 210    Blood type: --/--/B POS (03/17 0843)  Rubella: Immune (08/01 0000)   I&O: I/O last 3 completed shifts: In: -  Out: 550 [Urine:250; Blood:300]             Lungs: Clear and unlabored  Heart: regular rate and rhythm / no murmurs  Abdomen: soft, non-tender, non-distended             Fundus: firm, non-tender, U-1  Perineum: 1st degree repair healing well  Lochia: minimal  Extremities: Trace edema, no calf pain or tenderness, No Homans    A/P: PPD # 1  40 y.o., [id number]   Principal Problem:   Status post vacuum-assisted vaginal delivery (3/17) Active Problems:   ROM (rupture of membranes), premature   Vacuum extractor delivery, delivered   Doing well - stable status  Routine post partum orders  Anticipate discharge tomorrow    Laury Deep, M, MSN, CNM 06/17/2015, 12:00 PM

## 2016-06-13 ENCOUNTER — Emergency Department
Admission: EM | Admit: 2016-06-13 | Discharge: 2016-06-13 | Disposition: A | Discharge status: Home / Self Care | Payer: Federal, State, Local not specified - PPO | Attending: Emergency Medicine | Admitting: Emergency Medicine

## 2016-06-13 DIAGNOSIS — Y999 Unspecified external cause status: Secondary | ICD-10-CM | POA: Diagnosis not present

## 2016-06-13 DIAGNOSIS — Y9389 Activity, other specified: Secondary | ICD-10-CM | POA: Diagnosis not present

## 2016-06-13 DIAGNOSIS — Z87891 Personal history of nicotine dependence: Secondary | ICD-10-CM | POA: Insufficient documentation

## 2016-06-13 DIAGNOSIS — Y92410 Unspecified street and highway as the place of occurrence of the external cause: Secondary | ICD-10-CM | POA: Diagnosis not present

## 2016-06-13 DIAGNOSIS — S161XXA Strain of muscle, fascia and tendon at neck level, initial encounter: Secondary | ICD-10-CM | POA: Diagnosis not present

## 2016-06-13 DIAGNOSIS — Z791 Long term (current) use of non-steroidal anti-inflammatories (NSAID): Secondary | ICD-10-CM | POA: Insufficient documentation

## 2016-06-13 DIAGNOSIS — S199XXA Unspecified injury of neck, initial encounter: Secondary | ICD-10-CM | POA: Diagnosis present

## 2016-06-13 MED ORDER — CYCLOBENZAPRINE HCL 10 MG PO TABS: 10.0000 mg | ORAL_TABLET | Freq: Three times a day (TID) | ORAL | 0 refills | Status: DC | PRN
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
## 2017-01-19 IMAGING — DX DG CHEST 2V
2 series · 2 of 2 positions shown · non-contrast
Comparison: 06/12/2010

CLINICAL DATA: Cough, sneezing, sore throat, fever. Symptoms for 3
weeks.

EXAM:
CHEST  2 VIEW

[chest pa]
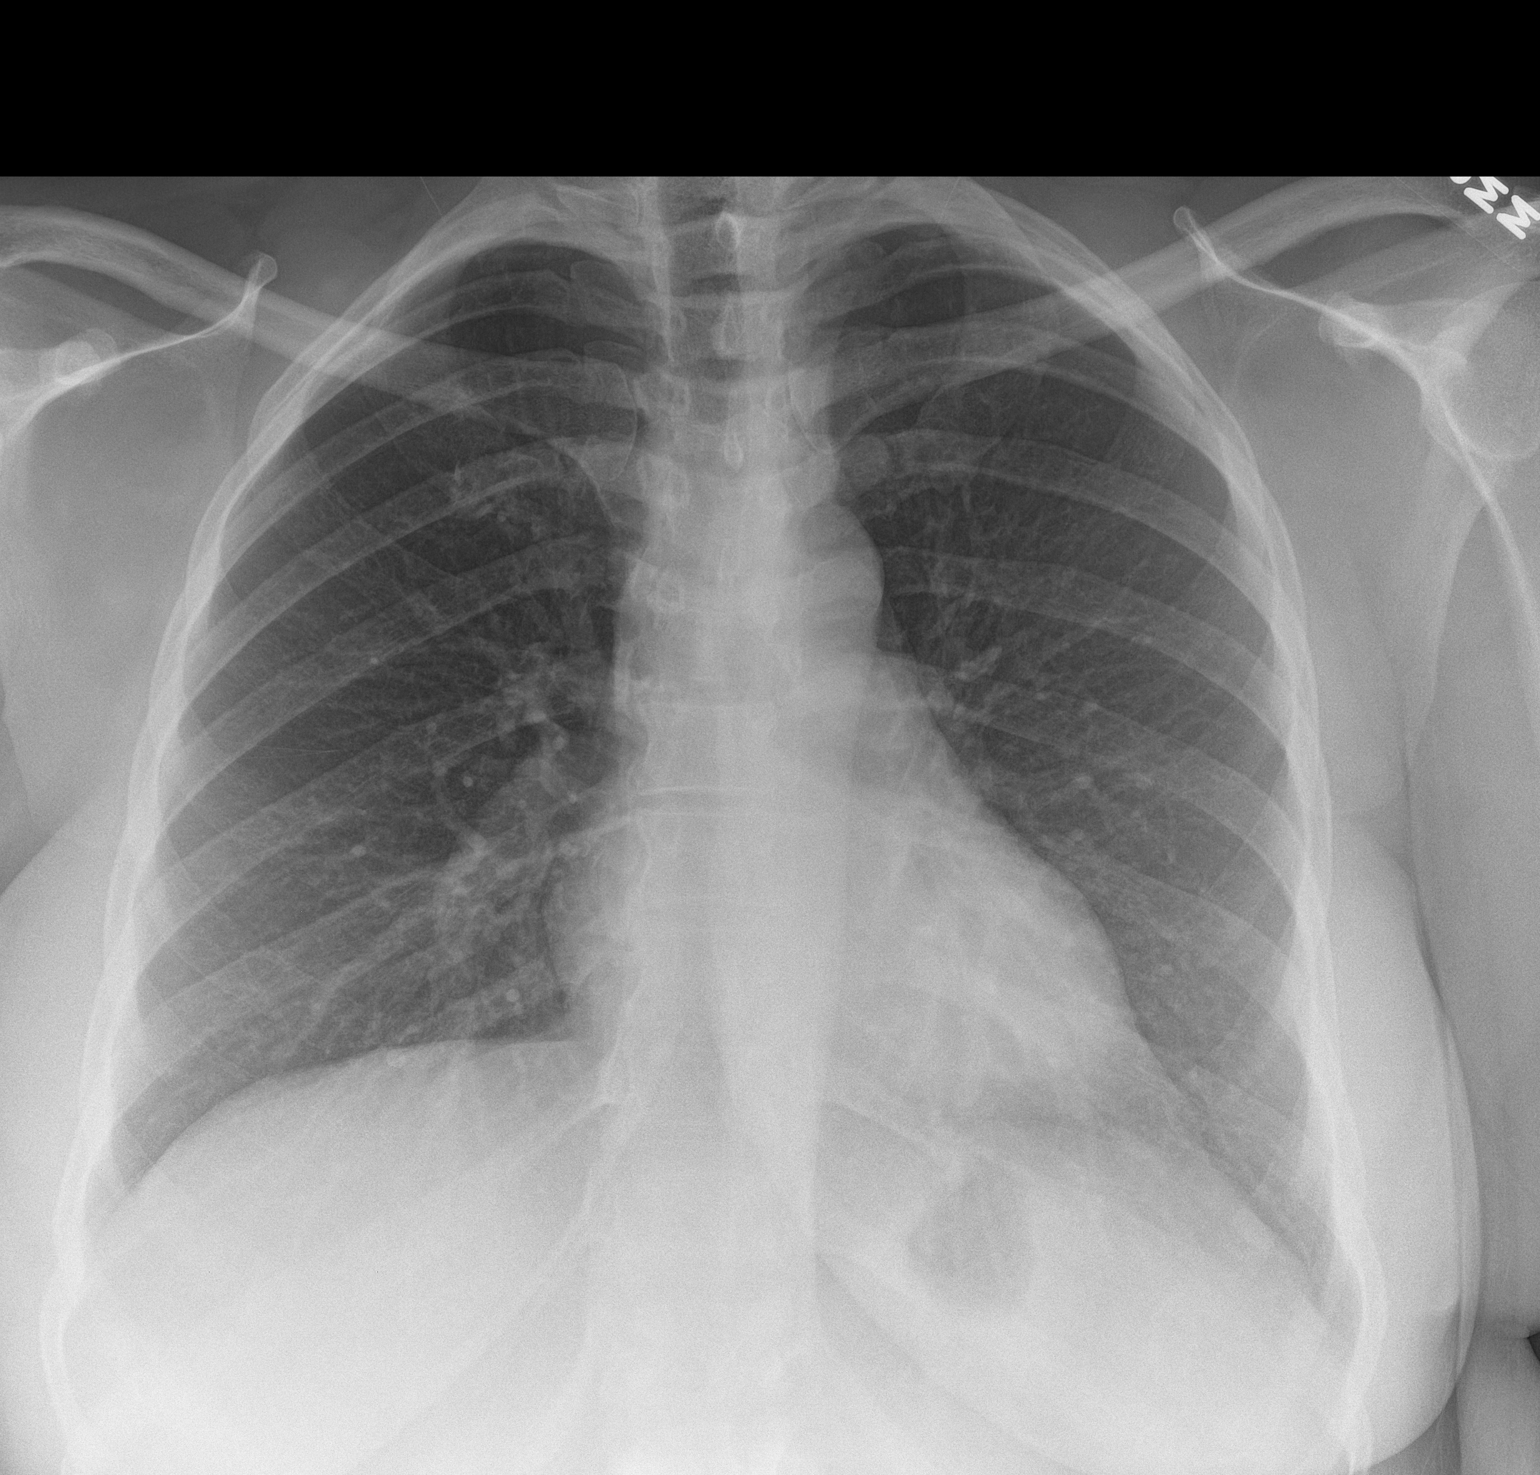

[chest lat]
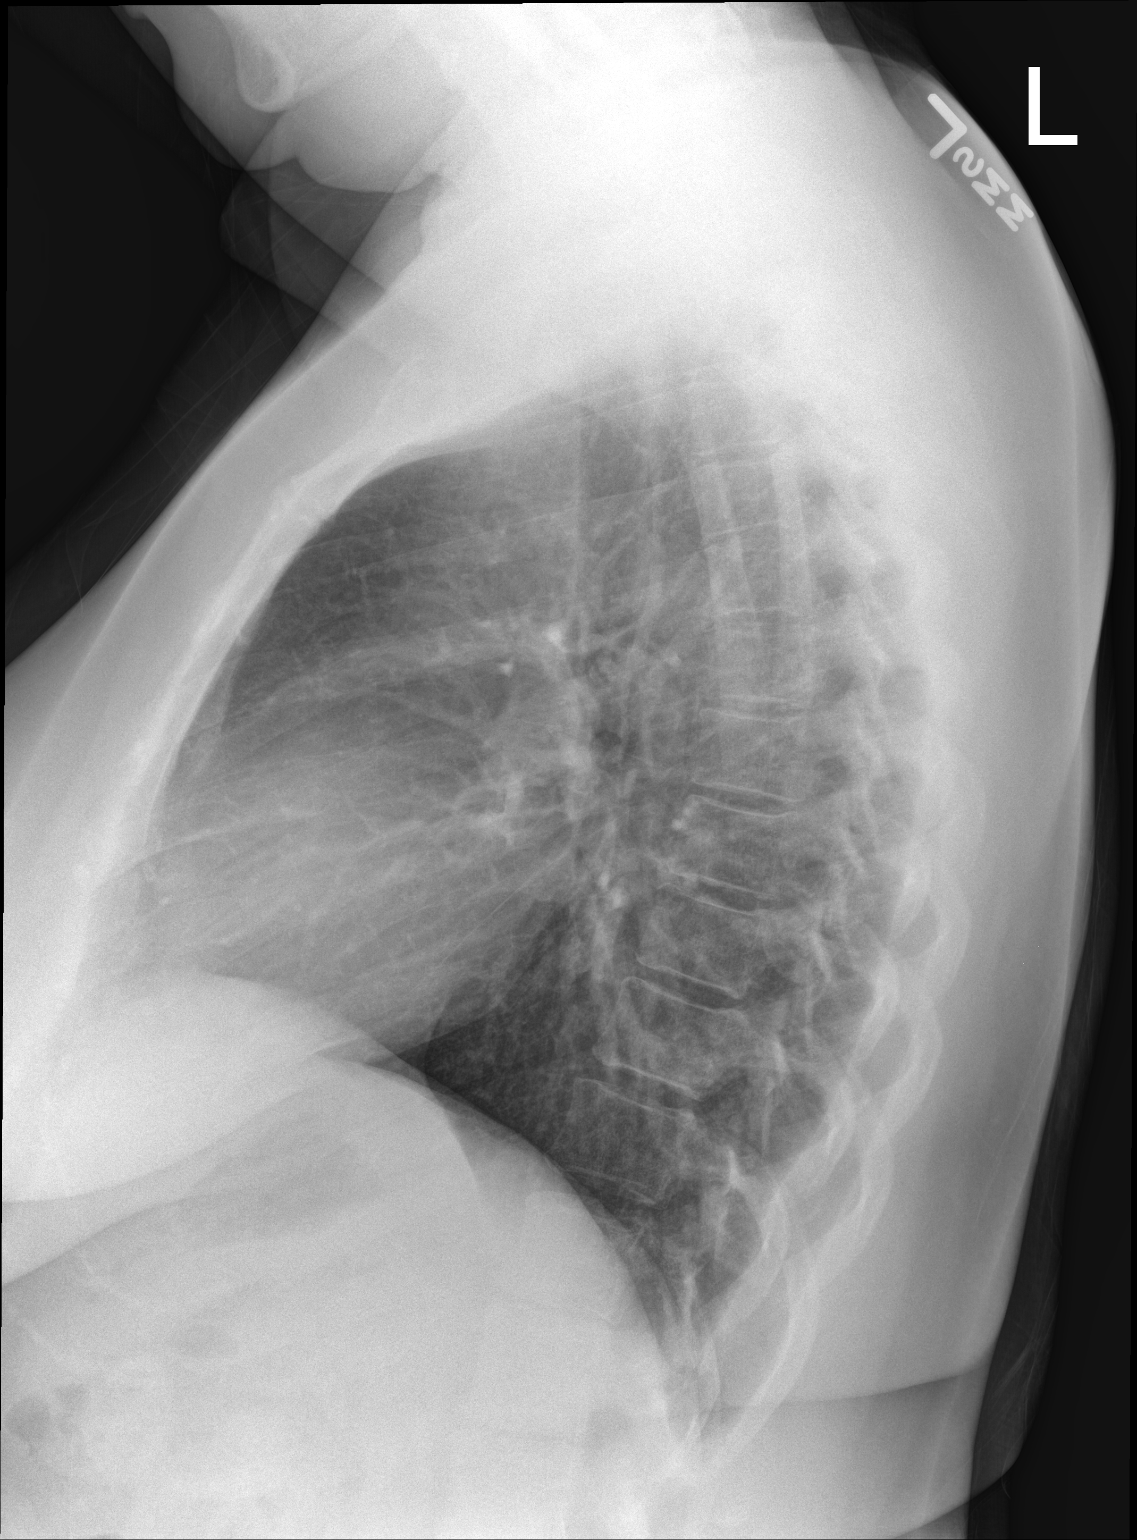

[2 of 2 positions shown; findings below may reference images not displayed]

FINDINGS: Heart and mediastinal contours are within normal limits. No focal
opacities or effusions. No acute bony abnormality.
IMPRESSION: No active cardiopulmonary disease.

## 2018-09-04 ENCOUNTER — Other Ambulatory Visit: Payer: Self-pay

## 2018-09-04 ENCOUNTER — Ambulatory Visit (HOSPITAL_COMMUNITY)
Admission: EM | Admit: 2018-09-04 | Discharge: 2018-09-04 | Disposition: A | Discharge status: Home / Self Care | Payer: Federal, State, Local not specified - PPO | Attending: Family Medicine | Admitting: Family Medicine

## 2018-09-04 ENCOUNTER — Encounter (HOSPITAL_COMMUNITY): Payer: Self-pay

## 2018-09-04 DIAGNOSIS — S39012A Strain of muscle, fascia and tendon of lower back, initial encounter: Secondary | ICD-10-CM | POA: Diagnosis not present

## 2018-09-04 MED ORDER — CYCLOBENZAPRINE HCL 5 MG PO TABS: 5.0000 mg | ORAL_TABLET | Freq: Every day | ORAL | 0 refills | Status: DC

## 2018-09-04 MED ORDER — NAPROXEN 500 MG PO TABS: 500.0000 mg | ORAL_TABLET | Freq: Two times a day (BID) | ORAL | 0 refills | Status: DC
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
## 2018-09-04 NOTE — Discharge Instructions (Signed)
Light and regular activity as tolerated.  See exercises provided.  Heat application while active can help with muscle spasms.  Sleep with pillow under your knees.  Twice a day naproxen, take with food.  Muscle relaxer at night. May cause drowsiness. Please do not take if driving or drinking alcohol.

## 2019-03-25 ENCOUNTER — Other Ambulatory Visit: Payer: Federal, State, Local not specified - PPO

## 2019-11-24 ENCOUNTER — Emergency Department: Payer: Federal, State, Local not specified - PPO

## 2019-11-24 ENCOUNTER — Emergency Department
Admission: EM | Admit: 2019-11-24 | Discharge: 2019-11-24 | Disposition: A | Discharge status: Home / Self Care | Payer: Federal, State, Local not specified - PPO | Attending: Emergency Medicine | Admitting: Emergency Medicine

## 2019-11-24 ENCOUNTER — Encounter: Payer: Self-pay | Admitting: Emergency Medicine

## 2019-11-24 ENCOUNTER — Other Ambulatory Visit: Payer: Self-pay

## 2019-11-24 DIAGNOSIS — R05 Cough: Secondary | ICD-10-CM | POA: Insufficient documentation

## 2019-11-24 DIAGNOSIS — R0602 Shortness of breath: Secondary | ICD-10-CM | POA: Diagnosis not present

## 2019-11-24 DIAGNOSIS — Z5321 Procedure and treatment not carried out due to patient leaving prior to being seen by health care provider: Secondary | ICD-10-CM | POA: Insufficient documentation

## 2019-11-24 DIAGNOSIS — M791 Myalgia, unspecified site: Secondary | ICD-10-CM | POA: Insufficient documentation

## 2019-11-24 DIAGNOSIS — Z20822 Contact with and (suspected) exposure to covid-19: Secondary | ICD-10-CM | POA: Diagnosis not present

## 2019-11-24 DIAGNOSIS — R079 Chest pain, unspecified: Secondary | ICD-10-CM | POA: Diagnosis not present

## 2019-11-24 LAB — BASIC METABOLIC PANEL
Anion gap: 9 (ref 5–15)
BUN: 9 mg/dL (ref 6–20)
CO2: 26 mmol/L (ref 22–32)
Calcium: 8.6 mg/dL — ABNORMAL LOW (ref 8.9–10.3)
Chloride: 100 mmol/L (ref 98–111)
Creatinine, Ser: 0.89 mg/dL (ref 0.44–1.00)
GFR calc Af Amer: >60 mL/min (ref >60)
GFR calc non Af Amer: >60 mL/min (ref >60)
Glucose, Bld: 110 mg/dL — ABNORMAL HIGH (ref 70–99)
Potassium: 3.3 mmol/L — ABNORMAL LOW (ref 3.5–5.1)
Sodium: 135 mmol/L (ref 135–145)

## 2019-11-24 LAB — CBC
HCT: 38.2 % (ref 36.0–46.0)
Hemoglobin: 12.7 g/dL (ref 12.0–15.0)
MCH: 29.5 pg (ref 26.0–34.0)
MCHC: 33.2 g/dL (ref 30.0–36.0)
MCV: 88.8 fL (ref 80.0–100.0)
Platelets: 217 10*3/uL (ref 150–400)
RBC: 4.3 MIL/uL (ref 3.87–5.11)
RDW: 12.4 % (ref 11.5–15.5)
WBC: 3.9 10*3/uL — ABNORMAL LOW (ref 4.0–10.5)
nRBC: 0 % (ref 0.0–0.2)

## 2019-11-24 LAB — TROPONIN I (HIGH SENSITIVITY): Troponin I (High Sensitivity): 4 ng/L (ref <18)

## 2019-11-24 NOTE — ED Triage Notes (Signed)
Pt reports her daughter tested positive for COVID last Tuesday with no sx's and she has taken several tests herself because she has symptoms but they are all negative. Pt reports cough, bodyaches, CP and SOB

## 2019-11-29 ENCOUNTER — Telehealth: Payer: Self-pay | Admitting: Emergency Medicine

## 2019-11-29 NOTE — Telephone Encounter (Signed)
Called patient due to lwot to inquire about condition and follow up plans. She says she actually feels better today.  Says she has been short of breath for past several days.  She does not have pcp.  I told her urgent care was an option.  Explained xray and that they may want to repeat it.

## 2021-05-23 ENCOUNTER — Encounter (HOSPITAL_COMMUNITY): Payer: Self-pay | Admitting: Emergency Medicine

## 2021-05-23 ENCOUNTER — Emergency Department (HOSPITAL_COMMUNITY): Payer: Federal, State, Local not specified - PPO

## 2021-05-23 ENCOUNTER — Other Ambulatory Visit: Payer: Self-pay

## 2021-05-23 ENCOUNTER — Emergency Department (HOSPITAL_COMMUNITY)
Admission: EM | Admit: 2021-05-23 | Discharge: 2021-05-23 | Disposition: A | Discharge status: Home / Self Care | Payer: Federal, State, Local not specified - PPO | Attending: Emergency Medicine | Admitting: Emergency Medicine

## 2021-05-23 DIAGNOSIS — D259 Leiomyoma of uterus, unspecified: Secondary | ICD-10-CM | POA: Insufficient documentation

## 2021-05-23 DIAGNOSIS — N83519 Torsion of ovary and ovarian pedicle, unspecified side: Secondary | ICD-10-CM

## 2021-05-23 DIAGNOSIS — R1030 Lower abdominal pain, unspecified: Secondary | ICD-10-CM

## 2021-05-23 DIAGNOSIS — N83291 Other ovarian cyst, right side: Secondary | ICD-10-CM | POA: Insufficient documentation

## 2021-05-23 DIAGNOSIS — R109 Unspecified abdominal pain: Secondary | ICD-10-CM | POA: Diagnosis present

## 2021-05-23 DIAGNOSIS — N83209 Unspecified ovarian cyst, unspecified side: Secondary | ICD-10-CM

## 2021-05-23 DIAGNOSIS — D219 Benign neoplasm of connective and other soft tissue, unspecified: Secondary | ICD-10-CM

## 2021-05-23 LAB — CBC
HCT: 38.9 % (ref 36.0–46.0)
Hemoglobin: 12.1 g/dL (ref 12.0–15.0)
MCH: 28 pg (ref 26.0–34.0)
MCHC: 31.1 g/dL (ref 30.0–36.0)
MCV: 90 fL (ref 80.0–100.0)
Platelets: 331 10*3/uL (ref 150–400)
RBC: 4.32 MIL/uL (ref 3.87–5.11)
RDW: 13.2 % (ref 11.5–15.5)
WBC: 7 10*3/uL (ref 4.0–10.5)
nRBC: 0 % (ref 0.0–0.2)

## 2021-05-23 LAB — HIV ANTIBODY (ROUTINE TESTING W REFLEX): HIV Screen 4th Generation wRfx: NONREACTIVE

## 2021-05-23 LAB — WET PREP, GENITAL
Clue Cells Wet Prep HPF POC: NONE SEEN
Sperm: NONE SEEN
Trich, Wet Prep: NONE SEEN
WBC, Wet Prep HPF POC: >=10 — AB (ref <10)
Yeast Wet Prep HPF POC: NONE SEEN

## 2021-05-23 LAB — I-STAT BETA HCG BLOOD, ED (MC, WL, AP ONLY): I-stat hCG, quantitative: <5 m[IU]/mL (ref <5)

## 2021-05-23 LAB — COMPREHENSIVE METABOLIC PANEL
ALT: 15 U/L (ref 0–44)
AST: 19 U/L (ref 15–41)
Albumin: 3.7 g/dL (ref 3.5–5.0)
Alkaline Phosphatase: 58 U/L (ref 38–126)
Anion gap: 12 (ref 5–15)
BUN: 13 mg/dL (ref 6–20)
CO2: 18 mmol/L — ABNORMAL LOW (ref 22–32)
Calcium: 9 mg/dL (ref 8.9–10.3)
Chloride: 103 mmol/L (ref 98–111)
Creatinine, Ser: 0.81 mg/dL (ref 0.44–1.00)
GFR, Estimated: >60 mL/min (ref >60)
Glucose, Bld: 93 mg/dL (ref 70–99)
Potassium: 3.9 mmol/L (ref 3.5–5.1)
Sodium: 133 mmol/L — ABNORMAL LOW (ref 135–145)
Total Bilirubin: 0.4 mg/dL (ref 0.3–1.2)
Total Protein: 6.8 g/dL (ref 6.5–8.1)

## 2021-05-23 LAB — URINALYSIS, ROUTINE W REFLEX MICROSCOPIC
Bilirubin Urine: NEGATIVE
Glucose, UA: NEGATIVE mg/dL
Hgb urine dipstick: NEGATIVE
Ketones, ur: NEGATIVE mg/dL
Leukocytes,Ua: NEGATIVE
Nitrite: NEGATIVE
Protein, ur: NEGATIVE mg/dL
Specific Gravity, Urine: 1.011 (ref 1.005–1.030)
pH: 6 (ref 5.0–8.0)

## 2021-05-23 LAB — LIPASE, BLOOD: Lipase: 30 U/L (ref 11–51)

## 2021-05-23 MED ORDER — NAPROXEN 500 MG PO TABS: 500.0000 mg | ORAL_TABLET | Freq: Two times a day (BID) | ORAL | 0 refills | Status: DC

## 2021-05-23 MED ORDER — LACTATED RINGERS IV BOLUS
500.0000 mL | Freq: Once | INTRAVENOUS | Status: AC
  Administered 2021-05-23: 500 mL via INTRAVENOUS

## 2021-05-23 MED ORDER — KETOROLAC TROMETHAMINE 15 MG/ML IJ SOLN
15.0000 mg | Freq: Once | INTRAMUSCULAR | Status: AC
  Administered 2021-05-23: 15 mg via INTRAVENOUS
  Filled 2021-05-23: qty 1

## 2021-05-23 MED ORDER — IOHEXOL 350 MG/ML SOLN
80.0000 mL | Freq: Once | INTRAVENOUS | Status: AC | PRN
  Administered 2021-05-23: 80 mL via INTRAVENOUS

## 2021-05-23 MED ORDER — ONDANSETRON HCL 4 MG/2ML IJ SOLN
4.0000 mg | Freq: Once | INTRAMUSCULAR | Status: AC
  Administered 2021-05-23: 4 mg via INTRAVENOUS
  Filled 2021-05-23: qty 2

## 2021-05-23 MED ORDER — MORPHINE SULFATE (PF) 4 MG/ML IV SOLN
4.0000 mg | Freq: Once | INTRAVENOUS | Status: AC
  Administered 2021-05-23: 4 mg via INTRAVENOUS
  Filled 2021-05-23: qty 1

## 2021-05-23 NOTE — ED Triage Notes (Signed)
Patient BIB [facility] from work for evaluation sudden onset of abdominal pain that started earlier today. Patient received 4mg  zofran and 6mcg fentanyl from EMS PTA. 18g saline lock in right AC. Patient alert, oriented, and in no apparent distress at this time.

## 2021-05-23 NOTE — ED Provider Triage Note (Signed)
Emergency Medicine Provider Triage Evaluation Note  Maureen Schwartz , a 46 y.o. female  was evaluated in triage.  Pt complains of sudden onset lower abdominal pain.  Patient reports pain started suddenly to her suprapubic area and radiated to bilateral flanks.  Pain has been constant since then however has gradually improved.  Patient states that originally pain was sharp and however now does not ache.  Patient endorses associated nausea.  Endorses occasional alcohol use.  Denies any illicit drug use.  LMP 3 weeks prior.  No history of abdominal surgeries.  Review of Systems  Positive: Abdominal pain, nausea Negative: Dysuria, hematuria, urinary urgency, vaginal pain, vaginal bleeding, vaginal discharge, vomiting, fever, chills  Physical Exam  BP 126/72 (BP Location: Right Arm)    Pulse 73    Temp 98.6 F (37 C) (Oral)    Resp 18    SpO2 100%  Gen:   Awake, no distress   Resp:  Normal effort  MSK:   Moves extremities without difficulty  Other:  Abdomen soft, nondistended, tenderness throughout lower abdomen.  No guarding or rebound tenderness.  Medical Decision Making  Medically screening exam initiated at 12:31 PM.  Appropriate orders placed.  Maureen Schwartz was informed that the remainder of the evaluation will be completed by another provider, this initial triage assessment does not replace that evaluation, and the importance of remaining in the ED until their evaluation is complete.  Patient received 4 mg Zofran with EMS.  Due to sudden onset of lower abdominal pain concern for possible ovarian torsion.  Ultrasound imaging ordered.   Loni Beckwith, [place] 05/23/21 1232

## 2021-05-23 NOTE — Discharge Instructions (Addendum)
As we discussed today it appears you have a fibroid and a large cyst.  Please call OB/GYN tomorrow to set up a follow-up appointment.  Tell them you are an emergency room follow-up.  If your pain becomes uncontrollable, you develop fevers, or have any new or concerning symptoms please seek additional medical care and evaluation.  You were given a prescription for naproxen.  Please do not take other NSAID medications such as Aleve, Advil, ibuprofen, Motrin, Voltaren or similar. You may take Tylenol as these work differently.   Please take Tylenol (acetaminophen) to relieve your pain.  You may take tylenol, up to 1,000 mg (two extra strength pills).  Do not take more than 3,000 mg tylenol in a 24 hour period.  Please check all medication labels as many medications such as pain and cold medications may contain tylenol. Please do not drink alcohol while taking this medication.    Today you received medications that may make you sleepy or impair your ability to make decisions.  For the next 24 hours please do not drive, operate heavy machinery, care for a small child with out another adult present, or perform any activities that may cause harm to you or someone else if you were to fall asleep or be impaired.    As you requested this information for reference I am giving you the information on the maximum safe dosing of ibuprofen and Tylenol.  It is important that you do not take ibuprofen while taking the prescribed naproxen.   Please take Ibuprofen (Advil, motrin) and Tylenol (acetaminophen) to relieve your pain.    You may take up to 600 MG (3 pills) of normal strength ibuprofen every 8 hours as needed.   You make take tylenol, up to 1,000 mg (two extra strength pills) every 8 hours as needed.   It is safe to take ibuprofen and tylenol at the same time as they work differently.   Do not take more than 3,000 mg tylenol in a 24 hour period (not more than one dose every 8 hours.  Please check all  medication labels as many medications such as pain and cold medications may contain tylenol.  Do not drink alcohol while taking these medications.  Do not take other NSAID'S while taking ibuprofen (such as aleve or naproxen).  Please take ibuprofen with food to decrease stomach upset.

## 2021-05-23 NOTE — ED Provider Notes (Signed)
[facility] EMERGENCY DEPARTMENT Provider Note   CSN: [id number] Arrival date & time: 05/23/21  1207     History  Chief Complaint  Patient presents with   Abdominal Pain    Maureen Schwartz is a 46 y.o. female who presents today for evaluation of abdominal pain.  She states that this morning she was at work helping clean a cabinet when she felt like she was having a panic attack.  She states that when not subsided she had pain in her lower abdomen.  She states that originally started on the right side but is since spread.  She has been nauseated all day but denies vomiting.  No diarrhea.  She denies any abnormal discharge.  No urinary symptoms or fevers.  No cough or shortness of breath.  She is sexually active.  She has a history of a fibroid but denies any other OB/GYN history.  HPI     Home Medications Prior to Admission medications   Medication Sig Start Date End Date Taking? Authorizing Provider  cyclobenzaprine (FLEXERIL) 5 MG tablet Take 1 tablet (5 mg total) by mouth at bedtime. 09/04/18   Zigmund Gottron, NP  naproxen (NAPROSYN) 500 MG tablet Take 1 tablet (500 mg total) by mouth 2 (two) times daily. 05/23/21   Lorin Glass, PA-C  Prenatal Vit-Fe Fumarate-FA (MULTIVITAMIN-PRENATAL) 27-0.8 MG TABS tablet Take 1 tablet by mouth daily at 12 noon.    [provider]      Allergies    Patient has no known allergies.    Review of Systems   Review of Systems  Physical Exam Updated Vital Signs BP (!) 166/82    Pulse 70    Temp 98.7 F (37.1 C) (Oral)    Resp (!) 22    SpO2 100%  Physical Exam Vitals and nursing note reviewed. Exam conducted with a chaperone present (ED tech).  Constitutional:      General: She is not in acute distress.    Appearance: She is not diaphoretic.  HENT:     Head: Normocephalic and atraumatic.  Eyes:     General: No scleral icterus.       Right eye: No discharge.        Left eye: No discharge.      Conjunctiva/sclera: Conjunctivae normal.  Cardiovascular:     Rate and Rhythm: Normal rate and regular rhythm.     Heart sounds: Normal heart sounds.  Pulmonary:     Effort: Pulmonary effort is normal. No respiratory distress.     Breath sounds: Normal breath sounds. No stridor.  Abdominal:     General: There is no distension.     Palpations: Abdomen is soft.     Tenderness: There is abdominal tenderness (Generalized but worse in RLQ.). There is no guarding or rebound.  Genitourinary:    Vagina: Normal. No vaginal discharge.     Cervix: No cervical motion tenderness.     Uterus: Enlarged. Not tender.      Adnexa:        Right: No tenderness.         Left: No tenderness.    Musculoskeletal:        General: No deformity.     Cervical back: Normal range of motion.  Skin:    General: Skin is warm and dry.  Neurological:     Mental Status: She is alert.     Motor: No abnormal muscle tone.  Psychiatric:  Behavior: Behavior normal.    ED Results / Procedures / Treatments   Labs (all labs ordered are listed, but only abnormal results are displayed) Labs Reviewed  WET PREP, GENITAL - Abnormal; Notable for the following components:      Result Value   WBC, Wet Prep HPF POC >=10 (*)    All other components within normal limits  COMPREHENSIVE METABOLIC PANEL - Abnormal; Notable for the following components:   Sodium 133 (*)    CO2 18 (*)    All other components within normal limits  LIPASE, BLOOD  CBC  URINALYSIS, ROUTINE W REFLEX MICROSCOPIC  HIV ANTIBODY (ROUTINE TESTING W REFLEX)  RPR  I-STAT BETA HCG BLOOD, ED ([facility], [facility], [facility] ONLY)  GC/CHLAMYDIA PROBE AMP ([place]) NOT AT [facility]    EKG None  Radiology CT ABDOMEN PELVIS W CONTRAST  Result Date: 05/23/2021 CLINICAL DATA:  RLQ abdominal pain (Age >= 14y) EXAM: CT ABDOMEN AND PELVIS WITH CONTRAST TECHNIQUE: Multidetector CT imaging of the abdomen and pelvis was performed using the standard protocol following bolus  administration of intravenous contrast. RADIATION DOSE REDUCTION: This exam was performed according to the departmental dose-optimization program which includes automated exposure control, adjustment of the mA and/or kV according to patient size and/or use of iterative reconstruction technique. CONTRAST:  107mL OMNIPAQUE IOHEXOL 350 MG/ML SOLN COMPARISON:  None. FINDINGS: Lower chest: No acute abnormality. Hepatobiliary: There is a 1 and 1.7 cm fluid density lesion within the liver that likely represents a simple hepatic cyst. Subcentimeter hypodensities are too small to characterize. No gallstones, gallbladder wall thickening, or pericholecystic fluid. No biliary dilatation. Pancreas: No focal lesion. Normal pancreatic contour. No surrounding inflammatory changes. No main pancreatic ductal dilatation. Spleen: Normal in size without focal abnormality. Adrenals/Urinary Tract: No adrenal nodule bilaterally. Bilateral kidneys enhance symmetrically. No hydronephrosis. No hydroureter. The urinary bladder is unremarkable. Stomach/Bowel: Stomach is within normal limits. No evidence of bowel wall thickening or dilatation. Few scattered colonic diverticula. Appendix appears normal. Vascular/Lymphatic: No abdominal aorta or iliac aneurysm. Mild atherosclerotic plaque of the aorta and its branches. No abdominal, pelvic, or inguinal lymphadenopathy. Reproductive: 7.4 cm heterogeneous intramural urine lesion along the anterior uterus likely represents a fibroid. There is a 7.6 x 4 cm cystic septated lesion in the region of the right adnexa/rectouterine pouch of unclear etiology. Other: No intraperitoneal free fluid. No intraperitoneal free gas. No organized fluid collection. Musculoskeletal: No abdominal wall hernia or abnormality. No suspicious lytic or blastic osseous lesions. No acute displaced fracture. L5-S1 intervertebral disc space vacuum phenomenon with associated mild endplate sclerosis. IMPRESSION: 1. A 7.6 x 4 cm  cystic septated lesion in the region of the right adnexa/rectouterine pouch of unclear etiology. Recommend pelvic ultrasound further evaluation. 2. A 7.4 cm intramural uterine fibroid. 3. Colonic diverticula with no acute diverticulitis. 4.  Aortic Atherosclerosis (ICD10-I70.0). Electronically Signed   By: Iven Finn M.D.   On: 05/23/2021 19:05   US PELVIC COMPLETE W TRANSVAGINAL AND TORSION R/O  Result Date: 05/23/2021 CLINICAL DATA:  Bilateral pelvic pain EXAM: TRANSABDOMINAL AND TRANSVAGINAL ULTRASOUND OF PELVIS DOPPLER ULTRASOUND OF OVARIES TECHNIQUE: Both transabdominal and transvaginal ultrasound examinations of the pelvis were performed. Transabdominal technique was performed for global imaging of the pelvis including uterus, ovaries, adnexal regions, and pelvic cul-de-sac. It was necessary to proceed with endovaginal exam following the transabdominal exam to visualize the ovaries. Color and duplex Doppler ultrasound was utilized to evaluate blood flow to the ovaries. COMPARISON:  MRI 08/07/2010 FINDINGS: Uterus Measurements: 16.3  x 8.2 x 11.8 cm = volume: 824 mL. Large fibroid at the posterior uterine body/fundus measuring 8.4 x 8.0 x 8.3 cm. Endometrium Poorly visualized secondary to poor penetration. No obvious abnormality. Right ovary Not visualized. Left ovary Measurements: 7.1 x 3.6 x 6.5 cm = volume: 86 mL. Complex 4.4 cm left ovarian cyst with layering hypoechoic fluid level with low level echoes. Additional adjacent 3.7 cm left ovarian cyst with fine internal web-like reticulations. Pulsed Doppler evaluation of the left ovary demonstrates normal low-resistance arterial and venous waveforms. Other findings No abnormal free fluid. IMPRESSION: 1. Nonvisualization of the right ovary. Right adnexal torsion is unable to be excluded by this exam. 2. No evidence of left ovarian torsion. 3. Left ovary is enlarged containing a 4.4 cm complex cyst, possibly endometrioma or hemorrhagic cyst. Additional  3.7 cm left ovarian cyst, more typical of a hemorrhagic cyst. Follow-up ultrasound in 6-12 weeks is recommended. 4. Enlarged uterus containing a 8.4 cm fibroid. Electronically Signed   By: Davina Poke D.O.   On: 05/23/2021 14:22    Procedures Procedures    Medications Ordered in ED Medications  lactated ringers bolus 500 mL (0 mLs Intravenous Stopped 05/23/21 2132)  morphine (PF) 4 MG/ML injection 4 mg (4 mg Intravenous Given 05/23/21 1701)  ondansetron (ZOFRAN) injection 4 mg (4 mg Intravenous Given 05/23/21 1700)  iohexol (OMNIPAQUE) 350 MG/ML injection 80 mL (80 mLs Intravenous Contrast Given 05/23/21 1851)  ketorolac (TORADOL) 15 MG/ML injection 15 mg (15 mg Intravenous Given 05/23/21 2131)    ED Course/ Medical Decision Making/ A&P                           Medical Decision Making Patient is a 46 year old woman who presents today for evaluation of lower abdominal pain.    Amount and/or Complexity of Data Reviewed Labs: ordered.    Details: UA without infection.  Pregnancy is negative.  Lipase is not elevated.  CMP without acute abnormalities.  CBC without anemia or leukocytosis. Wet prep is unremarkable. Radiology: ordered.    Details: Ultrasound was obtained showing concern for a left sided ovarian cyst and a large fibroid in the uterus. Given her pain is mostly right-sided a CT abdomen pelvis was obtained showing that the reported left-sided cyst appears to actually be a right-sided cyst and really showing the noted fibroid.  Risk OTC drugs. Prescription drug management. Decision regarding hospitalization.    Patient presents today for evaluation of abdominal pain. She originally had a ultrasound which showed a possible left-sided ovarian cyst and fibroid, CT scan showed a right-sided cyst, fibroid. It appears that she may have had some distortion within the large fibroid and the large size of the cyst creating this discrepancy.    Pelvic with out CMT, wet prep  unremarkable.  Suspect patient may have a hemorrhagic component to her cyst causing her pain.   I discussed pain management at home, she wishes for naproxen primarily.  She is given a dose of Toradol in the ER in addition to morphine and Zofran after which her pain and symptoms were controlled. She is given OB/GYN follow-up.  We discussed her abnormal result and the importance of follow-up and she states understanding.  Return precautions were discussed with patient who states their understanding.  At the time of discharge patient denied any unaddressed complaints or concerns.  Patient is agreeable for discharge home.  Note: Portions of this report may have been transcribed using voice  recognition software. Every effort was made to ensure accuracy; however, inadvertent computerized transcription errors may be present.    Final Clinical Impression(s) / ED Diagnoses Final diagnoses:  Lower abdominal pain  Fibroid  Cyst of ovary, unspecified laterality    Rx / DC Orders ED Discharge Orders          Ordered    naproxen (NAPROSYN) 500 MG tablet  2 times daily        05/23/21 2135              Ollen Gross 05/23/21 2337    Valarie Merino, MD 05/25/21 [phone]

## 2021-05-23 NOTE — ED Notes (Signed)
;(  urine)first attempt unsuccessful, pt has label specimen cup

## 2021-05-24 LAB — RPR: RPR Ser Ql: NONREACTIVE

## 2021-05-31 ENCOUNTER — Other Ambulatory Visit (HOSPITAL_COMMUNITY)
Admission: RE | Admit: 2021-05-31 | Discharge: 2021-05-31 | Disposition: A | Discharge status: Home / Self Care | Payer: Federal, State, Local not specified - PPO | Source: Ambulatory Visit | Attending: Obstetrics and Gynecology | Admitting: Obstetrics and Gynecology

## 2021-05-31 ENCOUNTER — Ambulatory Visit (INDEPENDENT_AMBULATORY_CARE_PROVIDER_SITE_OTHER): Payer: Federal, State, Local not specified - PPO | Admitting: Obstetrics and Gynecology

## 2021-05-31 ENCOUNTER — Encounter: Payer: Self-pay | Admitting: Obstetrics and Gynecology

## 2021-05-31 ENCOUNTER — Other Ambulatory Visit: Payer: Self-pay

## 2021-05-31 VITALS — BP 134/73 | HR 84 | Wt 229.0 lb

## 2021-05-31 DIAGNOSIS — Z124 Encounter for screening for malignant neoplasm of cervix: Secondary | ICD-10-CM | POA: Diagnosis not present

## 2021-05-31 DIAGNOSIS — N83202 Unspecified ovarian cyst, left side: Secondary | ICD-10-CM | POA: Diagnosis not present

## 2021-05-31 DIAGNOSIS — N926 Irregular menstruation, unspecified: Secondary | ICD-10-CM | POA: Diagnosis not present

## 2021-05-31 DIAGNOSIS — D259 Leiomyoma of uterus, unspecified: Secondary | ICD-10-CM

## 2021-05-31 NOTE — Progress Notes (Signed)
Obstetrics and Gynecology New Patient Evaluation  Appointment Date: 05/31/2021  OBGYN Clinic: [facility]  Referring Provider: [facility]  Chief Complaint: ED follow up  History of Present Illness: Maureen Schwartz is a 46 y.o. African-American [id number] (Patient's last menstrual period was 04/30/2021 (approximate).), seen for the above chief complaint. Her past medical history is significant for BMI 38, transient HTN, fibroids.  Patient went to [facility] ED for sudden consent abdominal  pain that started that day. She states it is lower belly to mid belly that is better but she still feels dullness at times.   ED w/u showed  16cm sized fibroid uterus with 8-9cm posterior IM fibroid and a 7cm left ovary with 4-5cm complex LO cyst with adjuacent 4cm one (see below), CT showed similar findings, no free fluid; GC/CT still pending  No constipation, diarrhea, bloating. Her periods are usually at the end of the month so her period is late. Hcg in ED was neg  Review of Systems: Pertinent items noted in HPI and remainder of comprehensive ROS otherwise negative.    Past Medical History:  Past Medical History:  Diagnosis Date   Anemia    Fibroid    Headache    Herpes    last outbreak July 2016   Status post vacuum-assisted vaginal delivery (3/17) 06/16/2015    Past Surgical History:  Past Surgical History:  Procedure Laterality Date   THERAPEUTIC ABORTION     cyst removed from urethra    Past Obstetrical History:  OB History  Gravida Para Term Preterm AB Living  3 1 1   2 1   SAB IAB Ectopic Multiple Live Births  1 1   0 1    # Outcome Date GA Lbr Len/2nd Weight Sex Delivery Anes PTL Lv  3 Term 06/16/15 [redacted]w[redacted]d 14:22 / 03:58 7 lb 4.6 oz (3.305 kg) F Vag-Vacuum EPI  LIV  2 SAB           1 IAB            Past Gynecological History: As per HPI. Periods: qmonth, regular, approx one week.  History of Pap Smear(s): unknown She is currently using no method for  contraception.   Social History:  Social History   Socioeconomic History   Marital status: Single    Spouse name: Not on file   Number of children: Not on file   Years of education: Not on file   Highest education level: Not on file  Occupational History   Not on file  Tobacco Use   Smoking status: Former    Types: Cigarettes    Quit date: 01/22/2006    Years since quitting: 15.3   Smokeless tobacco: Never  Substance and Sexual Activity   Alcohol use: No   Drug use: No   Sexual activity: Yes  Other Topics Concern   Not on file  Social History Narrative   Not on file   Social Determinants of Health   Financial Resource Strain: Not on file  Food Insecurity: Not on file  Transportation Needs: Not on file  Physical Activity: Not on file  Stress: Not on file  Social Connections: Not on file  Intimate Partner Violence: Not on file    Family History:  Family History  Problem Relation Age of Onset   Diabetes Maternal Aunt    Kidney disease Maternal Aunt    Cancer Maternal Uncle     Medications None  Allergies Patient has no known  allergies.  Physical Exam:  BP 134/73    Pulse 84    Wt 229 lb (103.9 kg)    LMP 04/30/2021 (Approximate)    BMI 38.11 kg/m  Body mass index is 38.11 kg/m.  General appearance: Well nourished, well developed female in no acute distress.  Neck:  Supple, normal appearance, and no thyromegaly  Cardiovascular: normal s1 and s2.  No murmurs, rubs or gallops. Respiratory:  Clear to auscultation bilateral. Normal respiratory effort Abdomen: positive bowel sounds, no hernias; diffusely non tender to palpation, non distended. obese Neuro/Psych:  Normal mood and affect.  Skin:  Warm and dry.  Lymphatic:  No inguinal lymphadenopathy.   Pelvic exam: is limited by body habitus EGBUS: within normal limits Vagina: within normal limits and with no blood or discharge in the vault Cervix: normal appearing cervix without tenderness, discharge or  lesions.  Uterus:  enlarged, c/w 12-14 week size and non tender, mobile. No obvious fibroid involvement in the LUS Adnexa:  normal adnexa and no mass, fullness, tenderness Rectovaginal: deferred  Laboratory: as per HPI  Radiology:  Narrative & Impression  CLINICAL DATA:  RLQ abdominal pain (Age >= 14y)   EXAM: CT ABDOMEN AND PELVIS WITH CONTRAST   TECHNIQUE: Multidetector CT imaging of the abdomen and pelvis was performed using the standard protocol following bolus administration of intravenous contrast.   RADIATION DOSE REDUCTION: This exam was performed according to the departmental dose-optimization program which includes automated exposure control, adjustment of the mA and/or kV according to patient size and/or use of iterative reconstruction technique.   CONTRAST:  45mL OMNIPAQUE IOHEXOL 350 MG/ML SOLN   COMPARISON:  None.   FINDINGS: Lower chest: No acute abnormality.   Hepatobiliary: There is a 1 and 1.7 cm fluid density lesion within the liver that likely represents a simple hepatic cyst. Subcentimeter hypodensities are too small to characterize. No gallstones, gallbladder wall thickening, or pericholecystic fluid. No biliary dilatation.   Pancreas: No focal lesion. Normal pancreatic contour. No surrounding inflammatory changes. No main pancreatic ductal dilatation.   Spleen: Normal in size without focal abnormality.   Adrenals/Urinary Tract:   No adrenal nodule bilaterally.   Bilateral kidneys enhance symmetrically.   No hydronephrosis. No hydroureter.   The urinary bladder is unremarkable.   Stomach/Bowel: Stomach is within normal limits. No evidence of bowel wall thickening or dilatation. Few scattered colonic diverticula. Appendix appears normal.   Vascular/Lymphatic: No abdominal aorta or iliac aneurysm. Mild atherosclerotic plaque of the aorta and its branches. No abdominal, pelvic, or inguinal lymphadenopathy.   Reproductive: 7.4 cm  heterogeneous intramural urine lesion along the anterior uterus likely represents a fibroid. There is a 7.6 x 4 cm cystic septated lesion in the region of the right adnexa/rectouterine pouch of unclear etiology.   Other: No intraperitoneal free fluid. No intraperitoneal free gas. No organized fluid collection.   Musculoskeletal:   No abdominal wall hernia or abnormality.   No suspicious lytic or blastic osseous lesions. No acute displaced fracture. L5-S1 intervertebral disc space vacuum phenomenon with associated mild endplate sclerosis.   IMPRESSION: 1. A 7.6 x 4 cm cystic septated lesion in the region of the right adnexa/rectouterine pouch of unclear etiology. Recommend pelvic ultrasound further evaluation. 2. A 7.4 cm intramural uterine fibroid. 3. Colonic diverticula with no acute diverticulitis. 4.  Aortic Atherosclerosis (ICD10-I70.0).     Electronically Signed   By: Iven Finn M.D.   On: 05/23/2021 19:05   Narrative & Impression  CLINICAL DATA:  Bilateral pelvic  pain   EXAM: TRANSABDOMINAL AND TRANSVAGINAL ULTRASOUND OF PELVIS   DOPPLER ULTRASOUND OF OVARIES   TECHNIQUE: Both transabdominal and transvaginal ultrasound examinations of the pelvis were performed. Transabdominal technique was performed for global imaging of the pelvis including uterus, ovaries, adnexal regions, and pelvic cul-de-sac.   It was necessary to proceed with endovaginal exam following the transabdominal exam to visualize the ovaries. Color and duplex Doppler ultrasound was utilized to evaluate blood flow to the ovaries.   COMPARISON:  MRI 08/07/2010   FINDINGS: Uterus   Measurements: 16.3 x 8.2 x 11.8 cm = volume: 824 mL. Large fibroid at the posterior uterine body/fundus measuring 8.4 x 8.0 x 8.3 cm.   Endometrium   Poorly visualized secondary to poor penetration. No obvious abnormality.   Right ovary   Not visualized.   Left ovary   Measurements: 7.1 x 3.6 x 6.5  cm = volume: 86 mL. Complex 4.4 cm left ovarian cyst with layering hypoechoic fluid level with low level echoes. Additional adjacent 3.7 cm left ovarian cyst with fine internal web-like reticulations.   Pulsed Doppler evaluation of the left ovary demonstrates normal low-resistance arterial and venous waveforms.   Other findings   No abnormal free fluid.   IMPRESSION: 1. Nonvisualization of the right ovary. Right adnexal torsion is unable to be excluded by this exam. 2. No evidence of left ovarian torsion. 3. Left ovary is enlarged containing a 4.4 cm complex cyst, possibly endometrioma or hemorrhagic cyst. Additional 3.7 cm left ovarian cyst, more typical of a hemorrhagic cyst. Follow-up ultrasound in 6-12 weeks is recommended. 4. Enlarged uterus containing a 8.4 cm fibroid.     Electronically Signed   By: Davina Poke D.O.   On: 05/23/2021 14:22   Assessment: pt stable  Plan:  1. Left ovarian cyst On u/s but CT said right. I told her that based on size and anatomy it can be hard to laterality at times but regardless, she does need follow up. U/s and CT images reviewed. Will do mid April rpt scan and f/u. Will do CA125 for today. RTC sooner if s/s come back - CA 125 - US PELVIC COMPLETE WITH TRANSVAGINAL; Future  2. Cervical cancer screening - Cytology - PAP( [facility])  3. Uterine leiomyoma, unspecified location  - US PELVIC COMPLETE WITH TRANSVAGINAL; Future  4. Late period - Beta hCG quant (ref lab)  Orders Placed This Encounter  Procedures   US PELVIC COMPLETE WITH TRANSVAGINAL   CA 125   Beta hCG quant (ref lab)    RTC after mid April u/s  Durene Romans MD Attending [facility])

## 2021-06-01 LAB — CA 125: Cancer Antigen (CA) 125: 31 U/mL (ref 0.0–38.1)

## 2021-06-01 LAB — BETA HCG QUANT (REF LAB): hCG Quant: <1 m[IU]/mL

## 2021-06-06 LAB — CYTOLOGY - PAP
Comment: NEGATIVE
Diagnosis: NEGATIVE
High risk HPV: NEGATIVE

## 2021-07-10 ENCOUNTER — Other Ambulatory Visit: Payer: Self-pay

## 2021-07-10 ENCOUNTER — Ambulatory Visit
Admission: RE | Admit: 2021-07-10 | Discharge: 2021-07-10 | Disposition: A | Discharge status: Home / Self Care | Payer: Federal, State, Local not specified - PPO | Source: Ambulatory Visit | Attending: Obstetrics and Gynecology | Admitting: Obstetrics and Gynecology

## 2021-07-10 DIAGNOSIS — N83202 Unspecified ovarian cyst, left side: Secondary | ICD-10-CM | POA: Diagnosis present

## 2021-07-10 DIAGNOSIS — D259 Leiomyoma of uterus, unspecified: Secondary | ICD-10-CM | POA: Diagnosis present

## 2021-07-11 ENCOUNTER — Ambulatory Visit (INDEPENDENT_AMBULATORY_CARE_PROVIDER_SITE_OTHER): Payer: Federal, State, Local not specified - PPO | Admitting: Obstetrics and Gynecology

## 2021-07-11 ENCOUNTER — Encounter: Payer: Self-pay | Admitting: Obstetrics and Gynecology

## 2021-07-11 VITALS — BP 150/91 | HR 83 | Wt 222.0 lb

## 2021-07-11 DIAGNOSIS — D259 Leiomyoma of uterus, unspecified: Secondary | ICD-10-CM

## 2021-07-11 DIAGNOSIS — N83291 Other ovarian cyst, right side: Secondary | ICD-10-CM | POA: Diagnosis not present

## 2021-07-11 NOTE — Progress Notes (Signed)
LMP: march 30-31 ?

## 2021-07-11 NOTE — Progress Notes (Signed)
RGYN here to F/U on U/S.  ? ?U/S done 07/10/21. ? ?Pt states abdominal pain has decreased.  ?

## 2021-07-11 NOTE — Progress Notes (Signed)
?Obstetrics and Gynecology Visit ?Return Patient Evaluation ? ?Appointment Date: 07/11/2021 ? ?Primary Care Provider: Aletha Halim ? ?OBGYN Clinic: [facility] ? ?Chief Complaint: ultrasound and pelvic pain follow up ? ?History of Present Illness:  Maureen Schwartz is a 46 y.o. with h/o fibroids, transient HTN, BMI 38. She was initially seen by me on 3/2 for ED f/u for abdominal pain and fibroid seen and complex ovarian cyst seen. CA125 and CEA negative and plan for repeat u/s ? ?Interval History: Since that time, she states that her LLQ pain is better and off and on and negative ROS otherwise. U/s done yesterday showed stable 8cm pendunuculated fibroid with stable 4cm ovarian cyst thought to be a hemorrhagic cyst or an endometrioma. Laterality hard to tell with last imaging but LO normal on this u/s show cyst felt to be from the right ovary.  ? ?Pap and hpv also negative from last visit.  ? ?Review of Systems: as noted in the History of Present Illness. ? ? ?Medications:  ?Zoe Lan had no medications administered during this visit. ?Current Outpatient Medications  ?Medication Sig Dispense Refill  ? phentermine 15 MG capsule Take 15 mg by mouth daily.    ? Vitamin D, Ergocalciferol, (DRISDOL) 1.25 MG (50000 UNIT) CAPS capsule Take 50,000 Units by mouth once a week.    ? ?No current facility-administered medications for this visit.  ? ? ?Allergies: has No Known Allergies. ? ?Physical Exam:  ?BP (!) 150/91   Pulse 83   Wt 222 lb (100.7 kg)   LMP 06/28/2021 (Approximate)   BMI 36.94 kg/m?  Body mass index is 36.94 kg/m?. ?General appearance: Well nourished, well developed female in no acute distress.  ?Neuro/Psych:  Normal mood and affect.   ? ?Radiology: ?Narrative & Impression  ?CLINICAL DATA:  Follow-up of LEFT ovarian cyst, LMP 06/28/2021 ?  ?EXAM: ?TRANSABDOMINAL AND TRANSVAGINAL ULTRASOUND OF PELVIS ?  ?TECHNIQUE: ?Both transabdominal and transvaginal ultrasound  examinations of the ?pelvis were performed. Transabdominal technique was performed for ?global imaging of the pelvis including uterus, ovaries, adnexal ?regions, and pelvic cul-de-sac. It was necessary to proceed with ?endovaginal exam following the transabdominal exam to visualize the ?endometrium and ovaries. ?  ?COMPARISON:  05/23/2021 ?  ?FINDINGS: ?Uterus ?  ?Measurements: 15.0 x 7.4 x 10.1 cm = volume: 590 mL. Anteverted. ?Heterogeneous myometrium. Large mass at uterine fundus consistent ?with leiomyoma, exophytic, 8.4 x 7.6 x 6.9 cm. No additional ?discrete mass. ?  ?Endometrium ?  ?Thickness: 15 mm.  No endometrial fluid or mass ?  ?Right ovary ?  ?Measurements: 4.5 x 4.2 x 4.8 cm = volume: 46.7 mL. Complex cyst ?identified within RIGHT ovary, 4.1 x 4.0 x 3.9 cm, previously 4.0 x ?4.4 x 4.3 cm, with fluid/fluid level seen on previous exam no longer ?identified. Blood flow present within RIGHT ovary on color Doppler ?imaging. Lesion is located within the cul-de-sac. Previously this ?lesion was felt to be located within the LEFT ovary. RIGHT ovary is ?not visualized on the previous study. A normal appearing LEFT ovary ?is visualized on the current exam. ?  ?Left ovary ?  ?Measurements: 3.1 x 2.6 x 2.2 cm = volume: 9.2 mL. Normal morphology ?without mass ?  ?Other findings ?  ?No free pelvic fluid.  No additional pelvic masses. ?  ?IMPRESSION: ?Large exophytic leiomyoma at uterine fundus 8.4 cm greatest ?diameter. ?  ?Complicated cystic lesion again identified, located in cul-de-sac, ?appears to arise from RIGHT ovary, with a separate  normal appearing ?LEFT ovary visualized on current study; previously this was felt to ?arise from the LEFT ovary. ?  ?Cyst is minimally smaller than on the previous exam and the ?previously seen hematocrit fluid/fluid level is no longer ?identified. ?  ?This could either represent a hemorrhagic cyst or an endometrioma. ?  ?Consider either follow-up characterization by MR imaging  with and ?without contrast or continued follow-up ultrasound assessment. ?  ?  ?Electronically Signed ?  By: Lavonia Dana M.D. ?  On: 07/10/2021 12:55  ? ? ?Assessment: pt stable ? ?Plan: ? ?1. Left ovarian cyst ?Images reviewed and it looks like either an HC or an endometrioma. Negative tumor markers and cyst is stable so she is very low risk for malignancy. Given this, recommend exp management with serial u/s which she is amenable to. I don't feel that hormone therapy in case it is an endometrioma is necessary given her age and risk of it causing side effects ?- US PELVIC COMPLETE WITH TRANSVAGINAL; Future ? ?2. Uterine leiomyoma, unspecified location ?Based on s/s, doubtful that she is having s/s from the pedunculated fibroid like torsion or necrosis.  ?- US PELVIC COMPLETE WITH TRANSVAGINAL; Future ? ?3. HTN ?Recommend talking to her weight loss PCP given she is on phentermine.  ? ?4. Fertility ?Questions answered re: this and [facility] clinic would recommend donor eggs if they deemed she was a reasonable candidate ? ?RTC: f/u 3-76mrpt u/s. Can increase to [id number] stable ? ?ChDurene RomansD ?Attending ?[facility] [employer]? ? ?

## 2021-08-06 ENCOUNTER — Telehealth: Payer: Self-pay | Admitting: Podiatry

## 2021-08-06 NOTE — Telephone Encounter (Signed)
Lvm for pt to call to confirm appt is in [place] office on 5.24 ?

## 2021-08-06 NOTE — Telephone Encounter (Signed)
Pt called back and is aware appt is in [facility] office. ?

## 2021-08-22 ENCOUNTER — Ambulatory Visit: Payer: Federal, State, Local not specified - PPO | Admitting: Podiatry

## 2021-08-22 ENCOUNTER — Encounter: Payer: Self-pay | Admitting: Podiatry

## 2021-08-22 DIAGNOSIS — M722 Plantar fascial fibromatosis: Secondary | ICD-10-CM | POA: Diagnosis not present

## 2021-08-22 NOTE — Progress Notes (Signed)
  Subjective:  Patient ID: Maureen Schwartz, female    DOB: [birth year],  MRN: [id number]  No chief complaint on file.   46 y.o. female presents with the above complaint.  Patient presents with complaint left heel pain that has been on for quite some time is progressive gotten worse is worse in the morning hurts with ambulation pain scale 7 out of 10.  She is going to beach soon she wanted make sure if it is good.  She denies seeing anyone else prior to seeing me.  She denies any other treatment options.   Review of Systems: Negative except as noted in the HPI. Denies N/V/F/Ch.  Past Medical History:  Diagnosis Date   Anemia    Fibroid    Headache    Herpes    last outbreak July 2016   Status post vacuum-assisted vaginal delivery (3/17) 06/16/2015    Current Outpatient Medications:    naproxen (NAPROSYN) 500 MG tablet, Take 1 tablet (500 mg total) by mouth 2 (two) times daily., Disp: 30 tablet, Rfl: 0   phentermine 15 MG capsule, Take 15 mg by mouth daily., Disp: , Rfl:    Vitamin D, Ergocalciferol, (DRISDOL) 1.25 MG (50000 UNIT) CAPS capsule, Take 50,000 Units by mouth once a week., Disp: , Rfl:   Social History   Tobacco Use  Smoking Status Former   Types: Cigarettes   Quit date: 01/22/2006   Years since quitting: 15.5  Smokeless Tobacco Never    No Known Allergies Objective:  There were no vitals filed for this visit. There is no height or weight on file to calculate BMI. Constitutional Well developed. Well nourished.  Vascular Dorsalis pedis pulses palpable bilaterally. Posterior tibial pulses palpable bilaterally. Capillary refill normal to all digits.  No cyanosis or clubbing noted. Pedal hair growth normal.  Neurologic Normal speech. Oriented to person, place, and time. Epicritic sensation to light touch grossly present bilaterally.  Dermatologic Nails well groomed and normal in appearance. No open wounds. No skin lesions.  Orthopedic: Normal joint ROM without  pain or crepitus bilaterally. No visible deformities. Tender to palpation at the calcaneal tuber left. No pain with calcaneal squeeze left. Ankle ROM diminished range of motion left. Silfverskiold Test: positive left.   Radiographs: None  Assessment:   1. Plantar fasciitis, left    Plan:  Patient was evaluated and treated and all questions answered.  Plantar Fasciitis, left - XR reviewed as above.  - Educated on icing and stretching. Instructions given.  - Injection delivered to the plantar fascia as below. - DME: Plantar fascial brace dispensed to support the medial longitudinal arch of the foot and offload pressure from the heel and prevent arch collapse during weightbearing - Pharmacologic management: None  Procedure: Injection Tendon/Ligament Location: Left plantar fascia at the glabrous junction; medial approach. Skin Prep: alcohol Injectate: 0.5 cc 0.5% marcaine plain, 0.5 cc of 1% Lidocaine, 0.5 cc kenalog 10. Disposition: Patient tolerated procedure well. Injection site dressed with a band-aid.  No follow-ups on file.

## 2021-10-11 ENCOUNTER — Ambulatory Visit: Payer: Federal, State, Local not specified - PPO

## 2021-10-24 ENCOUNTER — Encounter: Payer: Self-pay | Admitting: Podiatry

## 2021-10-24 ENCOUNTER — Ambulatory Visit: Payer: Federal, State, Local not specified - PPO | Admitting: Podiatry

## 2021-10-24 DIAGNOSIS — Q666 Other congenital valgus deformities of feet: Secondary | ICD-10-CM

## 2021-10-24 DIAGNOSIS — M722 Plantar fascial fibromatosis: Secondary | ICD-10-CM

## 2021-10-24 NOTE — Progress Notes (Signed)
  Subjective:  Patient ID: Maureen Schwartz, female    DOB: 1975/09/18,  MRN: [id number]  Chief Complaint  Patient presents with   Plantar Fasciitis    46 y.o. female presents with the above complaint.  Patient presents for follow-up of left plantar fasciitis.  She states her pain is 95% better.  She does not any further pain.  The injection helped considerably.  She still has some residual pain.  She would like to discuss other option/alternative injection.   Review of Systems: Negative except as noted in the HPI. Denies N/V/F/Ch.  Past Medical History:  Diagnosis Date   Anemia    Fibroid    Headache    Herpes    last outbreak July 2016   Status post vacuum-assisted vaginal delivery (3/17) 06/16/2015    Current Outpatient Medications:    naproxen (NAPROSYN) 500 MG tablet, Take 1 tablet (500 mg total) by mouth 2 (two) times daily., Disp: 30 tablet, Rfl: 0   phentermine 15 MG capsule, Take 15 mg by mouth daily., Disp: , Rfl:    Vitamin D, Ergocalciferol, (DRISDOL) 1.25 MG (50000 UNIT) CAPS capsule, Take 50,000 Units by mouth once a week., Disp: , Rfl:   Social History   Tobacco Use  Smoking Status Former   Types: Cigarettes   Quit date: 01/22/2006   Years since quitting: 15.7  Smokeless Tobacco Never    No Known Allergies Objective:  There were no vitals filed for this visit. There is no height or weight on file to calculate BMI. Constitutional Well developed. Well nourished.  Vascular Dorsalis pedis pulses palpable bilaterally. Posterior tibial pulses palpable bilaterally. Capillary refill normal to all digits.  No cyanosis or clubbing noted. Pedal hair growth normal.  Neurologic Normal speech. Oriented to person, place, and time. Epicritic sensation to light touch grossly present bilaterally.  Dermatologic Nails well groomed and normal in appearance. No open wounds. No skin lesions.  Orthopedic: Normal joint ROM without pain or crepitus bilaterally. No visible  deformities. Very mild tender to palpation at the calcaneal tuber left. No pain with calcaneal squeeze left. Ankle ROM diminished range of motion left. Silfverskiold Test: positive left.   Radiographs: None  Assessment:   No diagnosis found.  Plan:  Patient was evaluated and treated and all questions answered.  Plantar Fasciitis, left -Clinically mostly healed still some residual pain at this time I discussed that she will benefit from physical therapy and orthotics management primarily.  She would like to hold off on another injection.  I discussed with her that shoe gear modification is also important as well.  If her foot pain regresses to come back and see me right away she states understanding -Prescription for physical therapy was given for 6 weeks  -Pes planovalgus -I explained to patient the etiology of pes planovalgus and relationship with Planter fasciitis and various treatment options were discussed.  Given patient foot structure in the setting of Planter fasciitis I believe patient will benefit from custom-made orthotics to help control the hindfoot motion support the arch of the foot and take the stress away from plantar fascial.  Patient agrees with the plan like to proceed with orthotics -Patient was casted for orthotics   No follow-ups on file.

## 2021-11-02 ENCOUNTER — Other Ambulatory Visit: Payer: Self-pay | Admitting: *Deleted

## 2021-11-02 DIAGNOSIS — M722 Plantar fascial fibromatosis: Secondary | ICD-10-CM

## 2021-11-22 ENCOUNTER — Other Ambulatory Visit: Payer: Federal, State, Local not specified - PPO

## 2021-11-23 ENCOUNTER — Other Ambulatory Visit: Payer: Federal, State, Local not specified - PPO

## 2021-11-29 ENCOUNTER — Ambulatory Visit (INDEPENDENT_AMBULATORY_CARE_PROVIDER_SITE_OTHER): Payer: Federal, State, Local not specified - PPO

## 2021-11-29 DIAGNOSIS — Q666 Other congenital valgus deformities of feet: Secondary | ICD-10-CM

## 2021-11-29 NOTE — Progress Notes (Signed)
Patient presents today to pick up custom molded foot orthotics, diagnosed with pes planovalgus by Dr. Posey Pronto.   Orthotics were dispensed and fit was satisfactory. Reviewed instructions for break-in and wear. Written instructions given to patient.  Patient will follow up as needed.   [facility] - order # [id number]

## 2023-02-25 ENCOUNTER — Emergency Department: Payer: Federal, State, Local not specified - PPO

## 2023-02-25 ENCOUNTER — Observation Stay
Admission: EM | Admit: 2023-02-25 | Discharge: 2023-02-26 | Disposition: A | Discharge status: Home / Self Care | Payer: Federal, State, Local not specified - PPO | Attending: Obstetrics and Gynecology | Admitting: Obstetrics and Gynecology

## 2023-02-25 ENCOUNTER — Other Ambulatory Visit: Payer: Self-pay

## 2023-02-25 DIAGNOSIS — I10 Essential (primary) hypertension: Secondary | ICD-10-CM | POA: Insufficient documentation

## 2023-02-25 DIAGNOSIS — E876 Hypokalemia: Secondary | ICD-10-CM | POA: Diagnosis not present

## 2023-02-25 DIAGNOSIS — Z87891 Personal history of nicotine dependence: Secondary | ICD-10-CM | POA: Insufficient documentation

## 2023-02-25 DIAGNOSIS — N838 Other noninflammatory disorders of ovary, fallopian tube and broad ligament: Secondary | ICD-10-CM | POA: Insufficient documentation

## 2023-02-25 DIAGNOSIS — E669 Obesity, unspecified: Secondary | ICD-10-CM | POA: Insufficient documentation

## 2023-02-25 DIAGNOSIS — Z6841 Body Mass Index (BMI) 40.0 and over, adult: Secondary | ICD-10-CM | POA: Diagnosis not present

## 2023-02-25 DIAGNOSIS — Z79899 Other long term (current) drug therapy: Secondary | ICD-10-CM | POA: Insufficient documentation

## 2023-02-25 DIAGNOSIS — D649 Anemia, unspecified: Secondary | ICD-10-CM | POA: Insufficient documentation

## 2023-02-25 DIAGNOSIS — N83201 Unspecified ovarian cyst, right side: Secondary | ICD-10-CM | POA: Diagnosis not present

## 2023-02-25 DIAGNOSIS — R109 Unspecified abdominal pain: Secondary | ICD-10-CM | POA: Diagnosis present

## 2023-02-25 DIAGNOSIS — R102 Pelvic and perineal pain: Secondary | ICD-10-CM | POA: Diagnosis not present

## 2023-02-25 LAB — COMPREHENSIVE METABOLIC PANEL
ALT: 17 U/L (ref 0–44)
AST: 19 U/L (ref 15–41)
Albumin: 3.9 g/dL (ref 3.5–5.0)
Alkaline Phosphatase: 57 U/L (ref 38–126)
Anion gap: 8 (ref 5–15)
BUN: 16 mg/dL (ref 6–20)
CO2: 23 mmol/L (ref 22–32)
Calcium: 8.6 mg/dL — ABNORMAL LOW (ref 8.9–10.3)
Chloride: 106 mmol/L (ref 98–111)
Creatinine, Ser: 0.82 mg/dL (ref 0.44–1.00)
GFR, Estimated: >60 mL/min (ref >60)
Glucose, Bld: 132 mg/dL — ABNORMAL HIGH (ref 70–99)
Potassium: 3.3 mmol/L — ABNORMAL LOW (ref 3.5–5.1)
Sodium: 137 mmol/L (ref 135–145)
Total Bilirubin: 0.5 mg/dL (ref <1.2)
Total Protein: 7.3 g/dL (ref 6.5–8.1)

## 2023-02-25 LAB — CBC
HCT: 35.9 % — ABNORMAL LOW (ref 36.0–46.0)
Hemoglobin: 11.6 g/dL — ABNORMAL LOW (ref 12.0–15.0)
MCH: 28.9 pg (ref 26.0–34.0)
MCHC: 32.3 g/dL (ref 30.0–36.0)
MCV: 89.3 fL (ref 80.0–100.0)
Platelets: 287 10*3/uL (ref 150–400)
RBC: 4.02 MIL/uL (ref 3.87–5.11)
RDW: 13.2 % (ref 11.5–15.5)
WBC: 8.2 10*3/uL (ref 4.0–10.5)
nRBC: 0 % (ref 0.0–0.2)

## 2023-02-25 LAB — URINALYSIS, ROUTINE W REFLEX MICROSCOPIC
Bacteria, UA: NONE SEEN
Bilirubin Urine: NEGATIVE
Glucose, UA: NEGATIVE mg/dL
Ketones, ur: NEGATIVE mg/dL
Leukocytes,Ua: NEGATIVE
Nitrite: NEGATIVE
Protein, ur: NEGATIVE mg/dL
Specific Gravity, Urine: 1.024 (ref 1.005–1.030)
pH: 7 (ref 5.0–8.0)

## 2023-02-25 LAB — HCG, QUANTITATIVE, PREGNANCY: hCG, Beta Chain, Quant, S: <1 m[IU]/mL (ref <5)

## 2023-02-25 LAB — LIPASE, BLOOD: Lipase: 26 U/L (ref 11–51)

## 2023-02-25 LAB — POC URINE PREG, ED: Preg Test, Ur: NEGATIVE

## 2023-02-25 MED ORDER — ONDANSETRON HCL 4 MG PO TABS: 4.0000 mg | ORAL_TABLET | Freq: Four times a day (QID) | ORAL | Status: DC | PRN

## 2023-02-25 MED ORDER — MORPHINE SULFATE (PF) 2 MG/ML IV SOLN
2.0000 mg | Freq: Once | INTRAVENOUS | Status: AC
  Administered 2023-02-25: 2 mg via INTRAVENOUS
  Filled 2023-02-25: qty 1

## 2023-02-25 MED ORDER — SODIUM CHLORIDE 0.9 % IV BOLUS
500.0000 mL | Freq: Once | INTRAVENOUS | Status: AC
  Administered 2023-02-25: 500 mL via INTRAVENOUS

## 2023-02-25 MED ORDER — OXYCODONE HCL 5 MG PO TABS: 5.0000 mg | ORAL_TABLET | ORAL | Status: DC | PRN

## 2023-02-25 MED ORDER — POTASSIUM CHLORIDE 10 MEQ/100ML IV SOLN
10.0000 meq | INTRAVENOUS | Status: AC
  Administered 2023-02-25 – 2023-02-26 (×3): 10 meq via INTRAVENOUS
  Filled 2023-02-25 (×4): qty 100

## 2023-02-25 MED ORDER — ONDANSETRON HCL 4 MG/2ML IJ SOLN: 4.0000 mg | Freq: Four times a day (QID) | INTRAMUSCULAR | Status: DC | PRN

## 2023-02-25 MED ORDER — MORPHINE SULFATE (PF) 4 MG/ML IV SOLN
4.0000 mg | Freq: Once | INTRAVENOUS | Status: AC
  Administered 2023-02-25: 4 mg via INTRAVENOUS
  Filled 2023-02-25: qty 1

## 2023-02-25 MED ORDER — PRENATAL MULTIVITAMIN CH: 1.0000 | ORAL_TABLET | Freq: Every day | ORAL | Status: DC

## 2023-02-25 MED ORDER — IBUPROFEN 800 MG PO TABS
800.0000 mg | ORAL_TABLET | Freq: Three times a day (TID) | ORAL | Status: DC
  Administered 2023-02-26: 800 mg via ORAL
  Filled 2023-02-25: qty 1

## 2023-02-25 MED ORDER — FENTANYL CITRATE PF 50 MCG/ML IJ SOSY: 50.0000 ug | PREFILLED_SYRINGE | INTRAMUSCULAR | Status: DC | PRN

## 2023-02-25 MED ORDER — SODIUM CHLORIDE 0.9 % IV SOLN: 250.0000 mL | INTRAVENOUS | Status: DC | PRN

## 2023-02-25 MED ORDER — ONDANSETRON HCL 4 MG/2ML IJ SOLN
4.0000 mg | Freq: Once | INTRAMUSCULAR | Status: AC
  Administered 2023-02-25: 4 mg via INTRAVENOUS
  Filled 2023-02-25: qty 2

## 2023-02-25 MED ORDER — ACETAMINOPHEN 500 MG PO TABS
1000.0000 mg | ORAL_TABLET | Freq: Three times a day (TID) | ORAL | Status: DC
  Administered 2023-02-26 (×2): 1000 mg via ORAL
  Filled 2023-02-25 (×2): qty 2

## 2023-02-25 MED ORDER — IBUPROFEN 800 MG PO TABS: 800.0000 mg | ORAL_TABLET | Freq: Three times a day (TID) | ORAL | Status: DC

## 2023-02-25 MED ORDER — KETOROLAC TROMETHAMINE 30 MG/ML IJ SOLN
30.0000 mg | Freq: Once | INTRAMUSCULAR | Status: AC
  Administered 2023-02-26: 30 mg via INTRAVENOUS
  Filled 2023-02-25: qty 1

## 2023-02-25 MED ORDER — SODIUM CHLORIDE 0.9% FLUSH
3.0000 mL | Freq: Two times a day (BID) | INTRAVENOUS | Status: DC
  Administered 2023-02-26 (×2): 3 mL via INTRAVENOUS

## 2023-02-25 MED ORDER — ACETAMINOPHEN 500 MG PO TABS: 1000.0000 mg | ORAL_TABLET | Freq: Three times a day (TID) | ORAL | Status: DC

## 2023-02-25 MED ORDER — IOHEXOL 350 MG/ML SOLN
75.0000 mL | Freq: Once | INTRAVENOUS | Status: AC | PRN
  Administered 2023-02-25: 75 mL via INTRAVENOUS

## 2023-02-25 MED ORDER — SODIUM CHLORIDE 0.9% FLUSH: 3.0000 mL | INTRAVENOUS | Status: DC | PRN

## 2023-02-25 NOTE — ED Triage Notes (Signed)
First Nurse Note;  Pt via [facility] from [employer] parking lot. Pt c/o RLQ and NV. States has a hx of ovarian cyst ruptured a year ago and this feels the same. Pt is A&Ox4 and NAD 180/100 BP 96% on RA  Pt is A&Ox4 and NAD

## 2023-02-25 NOTE — ED Provider Notes (Signed)
[facility] Provider Note    Event Date/Time   First MD Initiated Contact with Patient 02/25/23 1502     (approximate)  History   Chief Complaint: Abdominal Pain  HPI  Maureen Schwartz is a 47 y.o. female with a past medical history of anemia, fibroids, presents to the emergency department for abdominal pain.  According to the patient since this morning she has been experiencing fairly diffuse abdominal pain although more so on the right side.  Patient states she has been nauseated with occasional episodes of vomiting today.  No diarrhea.  Last bowel movement was yesterday.  No urinary symptoms.  No vaginal discharge.  Currently on her menstrual cycle.  Patient states she has had similar pains in the past with ruptured ovarian cysts although states her pain today is more diffuse.  No fever.  Physical Exam   Triage Vital Signs: ED Triage Vitals [02/25/23 1325]  Encounter Vitals Group     BP (!) 140/95     Systolic BP Percentile      Diastolic BP Percentile      Pulse Rate 98     Resp 18     Temp 98 F (36.7 C)     Temp src      SpO2 99 %     Weight 260 lb (117.9 kg)     Height 5\' 6"  (1.676 m)     Head Circumference      Peak Flow      Pain Score 9     Pain Loc      Pain Education      Exclude from Growth Chart     Most recent vital signs: Vitals:   02/25/23 1325  BP: (!) 140/95  Pulse: 98  Resp: 18  Temp: 98 F (36.7 C)  SpO2: 99%    General: Awake, mild stress due to pain. CV:  Good peripheral perfusion.  Regular rate and rhythm  Resp:  Normal effort.  Equal breath sounds bilaterally.  Abd:  No distention.  Soft, moderate diffuse tenderness to palpation.  No focal area of tenderness identified.  No rebound or guarding.  ED Results / Procedures / Treatments   RADIOLOGY  Reviewed interpret the CT images.  Patient appears to have large fibroids but no other significant abnormality on my evaluation. Radiology has read the CT scan is a 6  x 6.8 cm rim-enhancing hypodense structure in the cul-de-sac increased in size this was previously characterized as originating from the right ovary findings may represent hemorrhagic cyst or endometrioma.   MEDICATIONS ORDERED IN ED: Medications  morphine (PF) 4 MG/ML injection 4 mg (has no administration in time range)  ondansetron (ZOFRAN) injection 4 mg (has no administration in time range)  sodium chloride 0.9 % bolus 500 mL (has no administration in time range)     IMPRESSION / MDM / ASSESSMENT AND PLAN / ED COURSE  I reviewed the triage vital signs and the nursing notes.  Patient's presentation is most consistent with acute presentation with potential threat to life or bodily function.  Patient presents to the emergency department for abdominal pain since this morning along with nausea vomiting.  Differential is quite broad but would include ovarian cyst, hemorrhagic cyst, hemoperitoneum, appendicitis, diverticulitis, colitis, UTI pyelonephritis.  Patient's lab work shows a reassuring CBC with a normal white blood cell count, chemistry is reassuring including normal LFTs, normal lipase.  Urinalysis is pending.  We will treat pain nausea IV hydrate and  proceed with CT imaging the abdomen/pelvis to further evaluate.  Patient agreeable to plan of care.  CT imaging appears show a large cyst versus endometrioma.  I spoke to OB/GYN Dr.Schermerhorn regarding this finding.  She recommends obtaining an ultrasound to further evaluate.  Ultrasound continues to show large what appears to be a possible hemorrhagic cyst off the right ovary but no signs of torsion.  OB has seen and evaluated the patient.  They will be admitting to their service for observation to ensure no worsening.   FINAL CLINICAL IMPRESSION(S) / ED DIAGNOSES   Abdominal pain Right ovarian cyst  Note:  This document was prepared using Dragon voice recognition software and may include unintentional dictation errors.    Minna Antis, MD 02/25/23 2236

## 2023-02-25 NOTE — ED Notes (Signed)
Pt given sandwich tray and water

## 2023-02-25 NOTE — ED Notes (Signed)
Pt in ultrasound

## 2023-02-25 NOTE — ED Notes (Signed)
See triage notes. Patient c/o right side abdominal pain. Patient has hx of ovarian cyst

## 2023-02-25 NOTE — ED Triage Notes (Signed)
See first nurse note. Pt reports RLQ pain with nausea started within the hour. Currently on menstrual cycle

## 2023-02-25 NOTE — H&P (Signed)
GYN H&P Name: Maureen Schwartz MRN: [id number] Date of Service: 02/25/2023  CC: abdominal pain   HPI: Maureen Schwartz is a 47 y.o. [phone] with a hx of obesity, depression, Vit D deficiency, preDM, anemia, HLD, and fibroids who presented to the ED for abdominal pain and we are consulted for a right ovarian mass.  Per chart review, patient seen in the ED 05/23/21 for RLQ pain. CT A/P showed 1.7cm hepatic lesion, diverticulosis, an intramural fibroid, and a 7.6x4cm cystic septated right ovarian mass located in the rectouterine pouch. Pelvic ultrasound showed 16.3x8.2x11.8 cm uterus with a posterior/fundal fibroid measuring 8.4x8x8.3cm and a complex 4.4 cm L ovarian cyst with layering of hypoechoic fluid and an adjacent 3.7 left cyst with fine reticulations, right ovary not visualized. Seen by Dr. Vergie Living on 05/31/21 for follow-up and recommended repeat imaging. CA-125 normal at 31. Repeat ultrasound on 07/10/21 showed a 15x7.4x10.1 cm uterus with a 8.4cm fundal fibroid, a 4.1x4x3.9cm complex right ovarian cyst located in the cul-de-sac, and a normal left ovary. Followed up with Dr. Vergie Living on 07/11/21 and plan was for serial ultrasounds. Patient did not follow-up after this.   Presented to the ED with RLQ pain today. Vitals notable for BP 140/95 but otherwise wnl. CBC with mild anemia (Hb 11.6). CMP with K 3.3 and glucose 132. Lipase wnl. Bhcg negative. UA with moderate Hb but otherwise wnl. CT A/P w/ contrast showed a fibroid uterus and a 6x6.8 cm rim enhancing hypodense structure in the cul-de-sac, left ovary wnl, appendix wnl. Pelvic ultrasound showed a 6.2 x 6.0 x 5.8 cm complex right ovarian cyst. Received IV morphine 4 mg at 1519 and 2 mg at 1828.   Patient reports sudden onset of severe RLQ pain since 12pm. Reports nausea and 1 episode of vomiting today. Reports pain 8/10 prior to IV medication and improved to 3/10. Reports once arrived here pain spread to diffuse all over abdomen. Remains at a 3/10 now.  No nausea now. Reports this is the same as last year. Reports last time, sudden onset of right lower quadrant pain. Received IV pain medication and then within 12 hours pain was better and went home. By the next day, no more pain. Had nausea but no vomiting. Patient reports she is hungry now and wants to know if she can eat and drink.   Denies fevers, CP, SOB.    ROS: All other systems reviewed and negative   GYN Hx: - LMP: Patient's last menstrual period was 02/25/2023.  - Menses: regular, 5d bleeding every q28d - Pap hx: Denies hx of abnormal paps, last pap 05/31/21 NILM with neg HRHPV, denies hx of excisional procedures. - STI hx: Denies hx of STIs - Sexual preference: sexually active with 1 female partner - Fertility plans: has considered trying to have another child with current partner but has not been trying currently - Contraceptive method: condoms - Abdominal surgeries: none - GYN procedures: D&C for TAB   OB Hx:  OB History  Gravida Para Term Preterm AB Living  3 1 1   2 1   SAB IAB Ectopic Multiple Live Births  1 1   0 1    # Outcome Date GA Lbr Len/2nd Weight Sex Type Anes PTL Lv  3 Term 06/16/15 [redacted]w[redacted]d 14:22 / 03:58 3305 g F Vag-Vacuum EPI  LIV  2 SAB           1 IAB              PMH:  Past Medical History:  Diagnosis Date   Anemia    Fibroid    Headache    Herpes    last outbreak July 2016   Status post vacuum-assisted vaginal delivery (3/17) 06/16/2015     PSH:  Past Surgical History:  Procedure Laterality Date   THERAPEUTIC ABORTION     cyst removed from urethra     Meds:  Prior to Admission medications   Medication Sig Start Date End Date Taking? Authorizing Provider  naproxen (NAPROSYN) 500 MG tablet Take 1 tablet (500 mg total) by mouth 2 (two) times daily. 05/23/21   Cristina Gong, PA-C  phentermine 15 MG capsule Take 15 mg by mouth daily. 06/04/21   [provider]  Vitamin D, Ergocalciferol, (DRISDOL) 1.25 MG (50000 UNIT) CAPS capsule  Take 50,000 Units by mouth once a week. 06/05/21   [provider]     Allergies:  No Known Allergies   FH: Denies FHx of colon, endometrial, ovarian or breast cancer. Family History  Problem Relation Age of Onset   Diabetes Maternal Aunt    Kidney disease Maternal Aunt    Cancer Maternal Uncle      SH:  Social History   Socioeconomic History   Marital status: Single    Spouse name: Not on file   Number of children: Not on file   Years of education: Not on file   Highest education level: Not on file  Occupational History   Not on file  Tobacco Use   Smoking status: Former    Current packs/day: 0.00    Types: Cigarettes    Quit date: 01/22/2006    Years since quitting: 17.1   Smokeless tobacco: Never  Substance and Sexual Activity   Alcohol use: No   Drug use: No   Sexual activity: Yes  Other Topics Concern   Not on file  Social History Narrative   Not on file   Social Determinants of Health   Financial Resource Strain: Not on file  Food Insecurity: Not on file  Transportation Needs: Not on file  Physical Activity: Not on file  Stress: Not on file  Social Connections: Unknown (09/09/2022)   Received [facility]   Social Network    Social Network: Not on file  Intimate Partner Violence: Unknown (09/09/2022)   Received from [facility]   HITS    Physically Hurt: Not on file    Insult or Talk Down To: Not on file    Threaten Physical Harm: Not on file    Scream or Curse: Not on file    Objective:  Physical Exam: Vitals:   02/25/23 1325 02/25/23 1833  BP: (!) 140/95 (!) 140/91  Pulse: 98 90  Resp: 18 16  Temp: 98 F (36.7 C) 98.2 F (36.8 C)  SpO2: 99% 100%   Body mass index is 41.97 kg/m.   General: Alert, NAD HEENT: Mucus membranes moist.  CV: Regular rate Resp: No increased work of breathing Abd: BS present in all 4 quadrants, soft, mildly tender to palpation throughout with moderate TTP over RLQ, +voluntary guarding, no  rebound Skin: No rashes.  Ext: No peripheral edema. Psych: Appropriate affect.    Labs: Recent Labs    02/25/23 1328  WBC 8.2  HGB 11.6*  HCT 35.9*  PLT 287   Recent Labs    02/25/23 1328  NA 137  K 3.3*  CL 106  CO2 23  CREATININE 0.82  BUN 16  CALCIUM 8.6*  ALT 17  AST 19     Radiology:  CT ABDOMEN AND PELVIS WITH CONTRAST- 02/25/23   TECHNIQUE: Multidetector CT imaging of the abdomen and pelvis was performed using the standard protocol following bolus administration of intravenous contrast.   RADIATION DOSE REDUCTION: This exam was performed according to the departmental dose-optimization program which includes automated exposure control, adjustment of the mA and/or kV according to patient size and/or use of iterative reconstruction technique.   CONTRAST:  75mL OMNIPAQUE IOHEXOL 350 MG/ML SOLN   COMPARISON:  CT abdomen and pelvis 05/23/2021. Pelvic ultrasound 07/10/2021.   FINDINGS: Lower chest: No acute abnormality.   Hepatobiliary: There are scattered rounded hypodensities in the liver which are too small to characterize, likely cysts. These are unchanged from prior study. Gallbladder and bile ducts are within normal limits.   Pancreas: Unremarkable. No pancreatic ductal dilatation or surrounding inflammatory changes.   Spleen: Normal in size without focal abnormality.   Adrenals/Urinary Tract: Adrenal glands are unremarkable. Kidneys are normal, without renal calculi, focal lesion, or hydronephrosis. Bladder is unremarkable.   Stomach/Bowel: Stomach is within normal limits. Appendix appears normal. No evidence of bowel wall thickening, distention, or inflammatory changes.   Vascular/Lymphatic: No significant vascular findings are present. No enlarged abdominal or pelvic lymph nodes.   Reproductive: There is an 8.0 x 6.9 cm uterine fibroid similar to the prior study. Additional smaller fibroids are also again noted. There is a rim  enhancing hypodense structure in the cul-de-sac measuring 6.0 x 6.8 cm and 24 Hounsfield units. This is increased in size. This was previously characterized as originating from the right ovary. The left ovary is unremarkable.   Other: No abdominal wall hernia or abnormality. No abdominopelvic ascites.   Musculoskeletal: There are degenerative changes at L5-S1.   IMPRESSION: 1. 6.0 x 6.8 cm rim enhancing hypodense structure in the cul-de-sac, increased in size. This was previously characterized as originating from the right ovary. Findings may represent a hemorrhagic cyst or endometrioma. Other etiologies are not excluded. Recommend further evaluation with pelvic ultrasound MRI. 2. Uterine fibroids. 3. Normal appendix.   TRANSABDOMINAL AND TRANSVAGINAL ULTRASOUND OF PELVIS- 02/25/23   DOPPLER ULTRASOUND OF OVARIES   TECHNIQUE: Both transabdominal and transvaginal ultrasound examinations of the pelvis were performed. Transabdominal technique was performed for global imaging of the pelvis including uterus, ovaries, adnexal regions, and pelvic cul-de-sac.   Color and duplex Doppler ultrasound was utilized to evaluate blood flow to the ovaries.   COMPARISON:  Same day CT abdomen and pelvis and ultrasound 07/10/2021   FINDINGS: Uterus   Measurements: 15.3 x 7.6 x 8.9 cm = volume: 542 mL. Fibroid uterus including measuring up to 7.3 cm.   Endometrium   Thickness: 10 mm.  No focal abnormality visualized.   Right ovary   Measurements: 8.3 x 6.5 x 8.6 cm = volume: 244 mL. Complex cyst in the right ovary measuring 6.2 x 6.0 x 5.8 cm. There is a circumscribed area of mild hyperechogenicity within the cyst. No internal vascularity. The cyst has increased in size from 07/10/2021 when it measured 4.1 cm.   Left ovary   Measurements: 3.2 x 1.5 x 1.8 cm = volume: 7.1 mL. Normal appearance/no adnexal mass.   Pulsed Doppler evaluation of both ovaries demonstrates  normal low-resistance arterial and venous waveforms.   Other findings   No abnormal free fluid.   IMPRESSION: 1. Complex cyst in the right ovary measuring 6.2 cm, increased in size from 07/10/2021 when it measured 4.1 cm. Primary  differential considerations include endometrioma or hemorrhagic cyst. Follow-up ultrasound in 6-12 weeks is recommended or consider more definitive evaluation with nonemergent MRI with and without IV contrast. 2. No evidence of ovarian torsion. 3. Fibroid uterus.   Assessment/Plan: Maureen Schwartz is a 47 y.o. [phone] with a hx of obesity, depression, Vit D deficiency, preDM, anemia, HLD, and fibroids who presented to the ED for acute onset of abdominal pain in the setting of a stable right ovarian mass. Will admit for observation to monitor pain.  #Right ovarian mass #Abdominal pain - Vitals stable - UPT negative. CBC with mild anemia consistent with known chronic anemia. CMP wnl other than K 3.3. - Exam with mild diffuse abdominal tenderness and moderate tenderness in RLQ. +Guarding. No rebound.  - Imaging:  CT A/P 05/23/21: 7.6x4cm cystic septated right ovarian mass located in the rectouterine pouch Pelvic u/s 05/23/21: complex 4.4 cm L ovarian cyst with layering of hypoechoic fluid and an adjacent 3.7 left cyst with fine reticulations Pelvic u/s 07/10/21:4.1x4x3.9cm complex right ovarian cyst located in the cul-de-sac CT A/P 02/25/23: 6.0 x 6.8 cm rim enhancing hypodense structure in the cul-de-sac Pelvic u/s 02/25/23: 6.2 x 6.0 x 5.8 cm complex right ovarian cyst, blow flow to bilateral ovaries - Imaging over the past 20 months has shown large right complex ovarian mass of varying sizes. Likely overall stable in size.  - Differential includes ovarian torsion.  - Imaging today shows blood flow to both ovaries so unlikely complete ovarian torsion. Pain has improved signifiantly and exam equivocal. Cannot definitively rule out intermittent torsion. Discussed  this with patient and recommended admitting for observation to monitor pain. Patient in agreement. - Admit for observation - Pain medication: scheduled tylenol 1000 mg q8h, ibuprofen 800 mg q8h, oxycodone 5 mg q4h PRN, IV fentanyl 50 mcg q2h PRN for severe pain - Nausea: PO/IV Zofran q6h PRN - Patient can eat something small now and then NPO afterwards - If pain progresses overnight or vomiting returns, will re-evaluate and may need to operate overnight. Discussed if need to operate, would likely need to remove the entire ovary.  - If remains stable overnight and improves, would recommend close follow-up with her GYN Dr. Vergie Living  #Anemia, mild - Reports hx of anemia although only see anemia during previous pregnancy in the chart - Hb 11.6   #Hypokalemia - K 3.3 > IV repletion ordered  #Hypertension - BP elevated today and per chart review, essentially all BPs >130/80 in chart. Patient has hypertension. Discussed diagnosis.  - Recommended f/u with PCP    Electronically signed: Romana Juniper, MD, 02/25/2023 8:49 PM

## 2023-02-25 NOTE — ED Notes (Signed)
Pt back from [place]

## 2023-02-26 ENCOUNTER — Encounter: Payer: Self-pay | Admitting: Obstetrics and Gynecology

## 2023-02-26 LAB — BASIC METABOLIC PANEL
Anion gap: 6 (ref 5–15)
BUN: 15 mg/dL (ref 6–20)
CO2: 24 mmol/L (ref 22–32)
Calcium: 8.5 mg/dL — ABNORMAL LOW (ref 8.9–10.3)
Chloride: 107 mmol/L (ref 98–111)
Creatinine, Ser: 0.86 mg/dL (ref 0.44–1.00)
GFR, Estimated: >60 mL/min (ref >60)
Glucose, Bld: 108 mg/dL — ABNORMAL HIGH (ref 70–99)
Potassium: 4.2 mmol/L (ref 3.5–5.1)
Sodium: 137 mmol/L (ref 135–145)

## 2023-02-26 MED ORDER — ACETAMINOPHEN 500 MG PO TABS: 1000.0000 mg | ORAL_TABLET | Freq: Three times a day (TID) | ORAL | Status: AC

## 2023-02-26 MED ORDER — IBUPROFEN 800 MG PO TABS: 800.0000 mg | ORAL_TABLET | Freq: Three times a day (TID) | ORAL | 0 refills | Status: AC

## 2023-02-26 MED ORDER — ONDANSETRON HCL 4 MG PO TABS: 4.0000 mg | ORAL_TABLET | Freq: Three times a day (TID) | ORAL | 1 refills | Status: AC | PRN

## 2023-02-26 NOTE — Progress Notes (Signed)
GYN Progress Note   Name: Maureen Schwartz MRN: [id number] Date of service: 02/26/2023    Subjective    Overnight, received one time dose of scheduled Toradol and Tylenol. Did not need any oxycodone or fentanyl. Also did not need any Zofran.   This morning reports feeling much better. Reports pain improved and has been a 2/10 all night. No nausea/ vomiting. Ate a [place] sandwich, apple sauce, and graham crackers last night without any problems. Ambulating and voiding spontaneously without difficulty. Last BM yesterday.    Denies fevers/chills/CP/SOB   Objective   Temp:  [98 F (36.7 C)-99.8 F (37.7 C)] 98 F (36.7 C) (11/27 0523) Pulse Rate:  [82-99] 82 (11/27 0523) Resp:  [16-20] 18 (11/27 0523) BP: (110-140)/(60-108) 118/71 (11/27 0523) SpO2:  [97 %-100 %] 99 % (11/27 0523) Weight:  [117.9 kg] 117.9 kg (11/26 1325)  Intake/Output Summary (Last 24 hours) at 02/26/2023 0801 Last data filed at 02/26/2023 0209 Gross per 24 hour  Intake 800 ml  Output --  Net 800 ml     Gen: Alert, no acute distress CV: Regular rate Resp: No increased work of breathing Abd: Soft, mildly tender to palpation throughout, no rebound or guarding Ext: No peripheral edema. SCDs in place.      Labs: Recent Labs    02/25/23 1328  WBC 8.2  HGB 11.6*  HCT 35.9*  MCV 89.3  PLT 287   Recent Labs    02/25/23 1328 02/26/23 0607  NA 137 137  K 3.3* 4.2  CL 106 107  CO2 23 24  CREATININE 0.82 0.86  BUN 16 15  CALCIUM 8.6* 8.5*  ALT 17  --   AST 19  --    No results found for: "MG"     Assessment & Plan    Maureen Schwartz is a 47 y.o. [phone] with a hx of obesity, depression, Vit D deficiency, preDM, anemia, HLD, and fibroids who presented to the ED for acute onset of abdominal pain in the setting of a stable right ovarian mass.    #Right ovarian mass #Abdominal pain - Vitals stable - Admission labs: UPT negative. CBC with mild anemia. CMP wnl other than K 3.3. - Exam with mild  diffuse abdominal tenderness and moderate tenderness in RLQ. +Guarding. No rebound.  - Imaging:  CT A/P 05/23/21: 7.6x4cm cystic septated right ovarian mass located in the rectouterine pouch Pelvic u/s 05/23/21: complex 4.4 cm L ovarian cyst with layering of hypoechoic fluid and an adjacent 3.7 left cyst with fine reticulations Pelvic u/s 07/10/21:4.1x4x3.9cm complex right ovarian cyst located in the cul-de-sac CT A/P 02/25/23: 6.0 x 6.8 cm rim enhancing hypodense structure in the cul-de-sac Pelvic u/s 02/25/23: 6.2 x 6.0 x 5.8 cm complex right ovarian cyst, blow flow to bilateral ovaries - Imaging over the past 20 months has shown large right complex ovarian mass of varying sizes. Likely overall stable in size.  - Differential includes ovarian torsion. Imaging with blood flow to both ovaries so unlikely complete ovarian torsion.  - Pain significantly improved and nausea/ vomiting resolved. Patient feels well enough to go home. Stable for discharge. Patient desires to follow-up with a provider in [place]. Offered patient to follow-up with me and she agreed. Will see next week in clinic to schedule for surgery to remove right ovary.  - Strict return precautions discussed - Recommended scheduled tylenol 1000 mg q8h, ibuprofen 800 mg q8h - Rx PO Zofran q6h PRN for nausea   #Anemia, mild -  Reports hx of anemia although only see anemia during previous pregnancy in the chart - Hb 11.6    #Hypokalemia - K 3.3 > IV >4.2 this AM   #Hypertension - BP elevated and per chart review, essentially all BPs >130/80 in chart. Patient has hypertension. Discussed diagnosis.  - Recommended f/u with PCP   Dispo: Discharge home   Electronically signed: Romana Juniper, MD, 02/26/2023 8:01 AM

## 2023-02-26 NOTE — Discharge Summary (Signed)
GYN Discharge Summary    Patient Name: Maureen Schwartz   DOB:Jul 12, 1975  MEDICAL RECORD [id number]   Date of Admission: 02/25/2023 Date of Discharge:  02/26/2023 10:15 AM    Admitting Diagnosis: Pelvic pain in female [R10.2] Cyst of right ovary [N83.201] Abdominal pain [R10.9] Obesity Hypertension Hypokalemia Anemia  Discharge Diagnosis: Same   Condition on Discharge: Good    Brief Hospital Course:  Maureen Schwartz is a 47 y.o. [phone] with a hx of obesity, depression, Vit D deficiency, preDM, anemia, HLD, and fibroids who presented to the ED on 02/25/23 for acute onset of abdominal pain in the setting of a stable right ovarian mass. CT A/P and pelvic ultrasound showed overall stable large right complex ovarian mass. Good blood flow to ovaries seen on ultrasound making complete ovarian torsion unlikely. Patient was admitted for observation overnight to evaluate for intermittent torsion. Pain improved and nuasea/ vomiting resolved and she was discharged home in stable condition on 02/26/23 with scheduled follow-up in 1 week. Plan to schedule patient for surgery to remove right ovary given cyst has remained for almost two years.   #Anemia, mild - Reports hx of anemia although only see anemia during previous pregnancy in the chart - Hb 11.6 on admission   #Hypokalemia - K 3.3 on admission > IV >4.2 on discharge   #Hypertension - BP elevated and per chart review, essentially all BPs >130/80 in chart. Patient has hypertension. Discussed diagnosis.  - Recommended f/u with PCP   Discharge Medications:  Allergies as of 02/26/2023   No Known Allergies      Medication List     STOP taking these medications    naproxen 500 MG tablet Commonly known as: NAPROSYN   phentermine 15 MG capsule       TAKE these medications    acetaminophen 500 MG tablet Commonly known as: TYLENOL Take 2 tablets (1,000 mg total) by mouth every 8 (eight) hours for 5 days.   ibuprofen 800 MG  tablet Commonly known as: ADVIL Take 1 tablet (800 mg total) by mouth every 8 (eight) hours for 7 days.   ondansetron 4 MG tablet Commonly known as: Zofran Take 1 tablet (4 mg total) by mouth every 8 (eight) hours as needed for up to 14 days for nausea or vomiting.   Vitamin D (Ergocalciferol) 1.25 MG (50000 UNIT) Caps capsule Commonly known as: DRISDOL Take 50,000 Units by mouth once a week.          Discharge Activities: Pelvic rest x 3 weeks.    Discharge Diet:  Regular diet    Discharge Instructions:  Take all medications as directed and keep all follow-up appointments. Do not smoke, drink alcohol, or use drugs. Seek care immediately should you experience fever, inability to keep down food or liquids, severe abdominal pain.   Discharge Follow-Up:  Follow up next week   Kathalene Frames, MD, 02/26/2023

## 2023-03-12 ENCOUNTER — Other Ambulatory Visit: Payer: Self-pay | Admitting: Obstetrics and Gynecology
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
## 2023-04-04 ENCOUNTER — Encounter
Admission: RE | Admit: 2023-04-04 | Discharge: 2023-04-04 | Disposition: A | Discharge status: Home / Self Care | Payer: No Typology Code available for payment source | Source: Ambulatory Visit | Attending: Obstetrics and Gynecology | Admitting: Obstetrics and Gynecology

## 2023-04-04 ENCOUNTER — Other Ambulatory Visit: Payer: Self-pay

## 2023-04-04 VITALS — Ht 66.0 in | Wt 238.0 lb

## 2023-04-04 DIAGNOSIS — Z01812 Encounter for preprocedural laboratory examination: Secondary | ICD-10-CM

## 2023-04-04 DIAGNOSIS — I10 Essential (primary) hypertension: Secondary | ICD-10-CM

## 2023-04-04 HISTORY — DX: Unspecified osteoarthritis, unspecified site: M19.90

## 2023-04-04 HISTORY — DX: Vitamin D deficiency, unspecified: E55.9

## 2023-04-04 HISTORY — DX: Essential (primary) hypertension: I10

## 2023-04-04 HISTORY — DX: Anxiety disorder, unspecified: F41.9

## 2023-04-04 HISTORY — DX: Depression, unspecified: F32.A

## 2023-04-04 HISTORY — DX: Prediabetes: R73.03

## 2023-04-04 NOTE — Patient Instructions (Addendum)
 Your procedure is scheduled on:   Friday, Jan 12/2023  Report to the Registration Desk on the 1st floor of the [facility]. To find out your arrival time, please call [phone] between 1PM - 3PM on: Thursday, Jan 12/2023  If your arrival time is 6:00 am, do not arrive before that time as the [facility] entrance doors do not open until 6:00 am.  REMEMBER: Instructions that are not followed completely may result in serious medical risk, up to and including death; or upon the discretion of your surgeon and anesthesiologist your surgery may need to be rescheduled.  Do not eat food after midnight the night before surgery.  No gum chewing or hard candies.  You may however, drink CLEAR liquids up to 2 hours before you are scheduled to arrive for your surgery. Do not drink anything within 2 hours of your scheduled arrival time.  Clear liquids include: - water  - apple juice without pulp - gatorade (not RED colors) - black coffee or tea (Do NOT add milk or creamers to the coffee or tea) Do NOT drink anything that is not on this list.    In addition, your doctor has ordered for you to drink the provided:  Ensure Pre-Surgery Clear Carbohydrate Drink   Drinking this carbohydrate drink up to two hours before surgery helps to reduce insulin resistance and improve patient outcomes. Please complete drinking 2 hours before scheduled arrival time.  One week prior to surgery: Stop Anti-inflammatories (NSAIDS) such as Advil , Aleve , Ibuprofen , Motrin , Naproxen , Naprosyn  and Aspirin based products such as Excedrin, Goody's Powder, BC Powder. Stop ANY OVER THE COUNTER supplements until after surgery.  You may however, continue to take Tylenol  if needed for pain up until the day of surgery like multivitamin   Continue taking all of your other prescription medications up until the day of surgery.  ON THE DAY OF SURGERY ONLY TAKE THESE MEDICATIONS WITH SIPS OF WATER:  desvenlafaxine  (PRISTIQ)    No Alcohol for 24 hours before or after surgery.  No Smoking including e-cigarettes for 24 hours before surgery.  No chewable tobacco products for at least 6 hours before surgery.  No nicotine patches on the day of surgery.  Do not use any recreational drugs for at least a week (preferably 2 weeks) before your surgery.  Please be advised that the combination of cocaine and anesthesia may have negative outcomes, up to and including death. If you test positive for cocaine, your surgery will be cancelled.  On the morning of surgery brush your teeth with toothpaste and water, you may rinse your mouth with mouthwash if you wish. Do not swallow any toothpaste or mouthwash.  Use CHG Soap as directed on instruction sheet.-provided for you   Do not wear jewelry, make-up, hairpins, clips or nail polish.  For welded (permanent) jewelry: bracelets, anklets, waist bands, etc.  Please have this removed prior to surgery.  If it is not removed, there is a chance that hospital personnel will need to cut it off on the day of surgery.  Do not wear lotions, powders, or perfumes.   Do not shave body hair from the neck down 48 hours before surgery.  Contact lenses, hearing aids and dentures may not be worn into surgery.  Do not bring valuables to the hospital. [facility] is not responsible for any missing/lost belongings or valuables.    Notify your doctor if there is any change in your medical condition (cold, fever, infection).  Wear  comfortable clothing (specific to your surgery type) to the hospital.  After surgery, you can help prevent lung complications by doing breathing exercises.    If you are being discharged the day of surgery, you will not be allowed to drive home. You will need a responsible individual to drive you home and stay with you for 24 hours after surgery.    Please call the Pre-admissions Testing Dept. at [phone] if you have any questions about these  instructions.  Surgery Visitation Policy:  Patients having surgery or a procedure may have two visitors.  Children under the age of 60 must have an adult with them who is not the patient.      Preparing for Surgery with CHLORHEXIDINE  GLUCONATE (CHG) Soap  Chlorhexidine  Gluconate (CHG) Soap  o An antiseptic cleaner that kills germs and bonds with the skin to continue killing germs even after washing  o Used for showering the night before surgery and morning of surgery  Before surgery, you can play an important role by reducing the number of germs on your skin.  CHG (Chlorhexidine  gluconate) soap is an antiseptic cleanser which kills germs and bonds with the skin to continue killing germs even after washing.  Please do not use if you have an allergy to CHG or antibacterial soaps. If your skin becomes reddened/irritated stop using the CHG.  1. Shower the NIGHT BEFORE SURGERY and the MORNING OF SURGERY with CHG soap.  2. If you choose to wash your hair, wash your hair first as usual with your normal shampoo.  3. After shampooing, rinse your hair and body thoroughly to remove the shampoo.  4. Use CHG as you would any other liquid soap. You can apply CHG directly to the skin and wash gently with a scrungie or a clean washcloth.  5. Apply the CHG soap to your body only from the neck down. Do not use on open wounds or open sores. Avoid contact with your eyes, ears, mouth, and genitals (private parts). Wash face and genitals (private parts) with your normal soap.  6. Wash thoroughly, paying special attention to the area where your surgery will be performed.  7. Thoroughly rinse your body with warm water.  8. Do not shower/wash with your normal soap after using and rinsing off the CHG soap.  9. Pat yourself dry with a clean towel.  10. Wear clean pajamas to bed the night before surgery.  12. Place clean sheets on your bed the night of your first shower and do not sleep with  pets.  13. Shower again with the CHG soap on the day of surgery prior to arriving at the hospital.  14. Do not apply any deodorants/lotions/powders.  15. Please wear clean clothes to the hospital.

## 2023-04-06 ENCOUNTER — Encounter: Payer: Self-pay | Admitting: Urgent Care

## 2023-04-10 ENCOUNTER — Encounter
Admission: RE | Admit: 2023-04-10 | Discharge: 2023-04-10 | Disposition: A | Discharge status: Home / Self Care | Payer: BC Managed Care – PPO | Source: Ambulatory Visit | Attending: Obstetrics and Gynecology | Admitting: Obstetrics and Gynecology

## 2023-04-10 DIAGNOSIS — N736 Female pelvic peritoneal adhesions (postinfective): Secondary | ICD-10-CM | POA: Diagnosis not present

## 2023-04-10 DIAGNOSIS — D259 Leiomyoma of uterus, unspecified: Secondary | ICD-10-CM | POA: Diagnosis present

## 2023-04-10 DIAGNOSIS — Z01812 Encounter for preprocedural laboratory examination: Secondary | ICD-10-CM

## 2023-04-10 DIAGNOSIS — N839 Noninflammatory disorder of ovary, fallopian tube and broad ligament, unspecified: Secondary | ICD-10-CM | POA: Diagnosis not present

## 2023-04-10 DIAGNOSIS — I10 Essential (primary) hypertension: Secondary | ICD-10-CM | POA: Insufficient documentation

## 2023-04-10 DIAGNOSIS — Z01818 Encounter for other preprocedural examination: Secondary | ICD-10-CM | POA: Insufficient documentation

## 2023-04-10 DIAGNOSIS — R9431 Abnormal electrocardiogram [ECG] [EKG]: Secondary | ICD-10-CM | POA: Insufficient documentation

## 2023-04-10 DIAGNOSIS — Z87891 Personal history of nicotine dependence: Secondary | ICD-10-CM | POA: Diagnosis not present

## 2023-04-10 DIAGNOSIS — R7303 Prediabetes: Secondary | ICD-10-CM | POA: Diagnosis not present

## 2023-04-10 DIAGNOSIS — E669 Obesity, unspecified: Secondary | ICD-10-CM | POA: Diagnosis not present

## 2023-04-10 DIAGNOSIS — Z6838 Body mass index (BMI) 38.0-38.9, adult: Secondary | ICD-10-CM | POA: Diagnosis not present

## 2023-04-10 LAB — CBC
HCT: 36.1 % (ref 36.0–46.0)
Hemoglobin: 11.9 g/dL — ABNORMAL LOW (ref 12.0–15.0)
MCH: 29 pg (ref 26.0–34.0)
MCHC: 33 g/dL (ref 30.0–36.0)
MCV: 87.8 fL (ref 80.0–100.0)
Platelets: 313 10*3/uL (ref 150–400)
RBC: 4.11 MIL/uL (ref 3.87–5.11)
RDW: 13 % (ref 11.5–15.5)
WBC: 7.2 10*3/uL (ref 4.0–10.5)
nRBC: 0 % (ref 0.0–0.2)

## 2023-04-11 ENCOUNTER — Ambulatory Visit
Admission: RE | Admit: 2023-04-11 | Discharge: 2023-04-11 | Disposition: A | Discharge status: Home / Self Care | Payer: BC Managed Care – PPO | Attending: Obstetrics and Gynecology | Admitting: Obstetrics and Gynecology

## 2023-04-11 ENCOUNTER — Ambulatory Visit: Payer: BC Managed Care – PPO | Admitting: Anesthesiology

## 2023-04-11 ENCOUNTER — Other Ambulatory Visit: Payer: Self-pay

## 2023-04-11 ENCOUNTER — Encounter: Payer: Self-pay | Admitting: Obstetrics and Gynecology

## 2023-04-11 ENCOUNTER — Encounter
Admission: RE | Disposition: A | Discharge status: Home / Self Care | Payer: Self-pay | Source: Home / Self Care | Attending: Obstetrics and Gynecology

## 2023-04-11 DIAGNOSIS — N736 Female pelvic peritoneal adhesions (postinfective): Secondary | ICD-10-CM | POA: Insufficient documentation

## 2023-04-11 DIAGNOSIS — E669 Obesity, unspecified: Secondary | ICD-10-CM | POA: Insufficient documentation

## 2023-04-11 DIAGNOSIS — D259 Leiomyoma of uterus, unspecified: Secondary | ICD-10-CM | POA: Diagnosis not present

## 2023-04-11 DIAGNOSIS — N839 Noninflammatory disorder of ovary, fallopian tube and broad ligament, unspecified: Secondary | ICD-10-CM | POA: Insufficient documentation

## 2023-04-11 DIAGNOSIS — Z01818 Encounter for other preprocedural examination: Secondary | ICD-10-CM

## 2023-04-11 DIAGNOSIS — R7303 Prediabetes: Secondary | ICD-10-CM | POA: Insufficient documentation

## 2023-04-11 DIAGNOSIS — Z87891 Personal history of nicotine dependence: Secondary | ICD-10-CM | POA: Insufficient documentation

## 2023-04-11 DIAGNOSIS — I10 Essential (primary) hypertension: Secondary | ICD-10-CM | POA: Insufficient documentation

## 2023-04-11 DIAGNOSIS — Z6838 Body mass index (BMI) 38.0-38.9, adult: Secondary | ICD-10-CM | POA: Insufficient documentation

## 2023-04-11 HISTORY — PX: LAPAROSCOPY: SHX197

## 2023-04-11 LAB — POCT PREGNANCY, URINE: Preg Test, Ur: NEGATIVE

## 2023-04-11 SURGERY — LAPAROSCOPY, DIAGNOSTIC
Anesthesia: General | Site: Pelvis

## 2023-04-11 MED ORDER — FENTANYL CITRATE (PF) 100 MCG/2ML IJ SOLN: 25.0000 ug | INTRAMUSCULAR | Status: DC | PRN

## 2023-04-11 MED ORDER — ACETAMINOPHEN 650 MG RE SUPP: 650.0000 mg | RECTAL | Status: DC | PRN

## 2023-04-11 MED ORDER — SODIUM CHLORIDE 0.9% FLUSH: 3.0000 mL | INTRAVENOUS | Status: DC | PRN

## 2023-04-11 MED ORDER — ROCURONIUM BROMIDE 10 MG/ML (PF) SYRINGE
PREFILLED_SYRINGE | INTRAVENOUS | Status: DC | PRN
  Administered 2023-04-11: 20 mg via INTRAVENOUS
  Administered 2023-04-11: 30 mg via INTRAVENOUS

## 2023-04-11 MED ORDER — FENTANYL CITRATE (PF) 100 MCG/2ML IJ SOLN
INTRAMUSCULAR | Status: AC
  Filled 2023-04-11: qty 2

## 2023-04-11 MED ORDER — ROCURONIUM BROMIDE 10 MG/ML (PF) SYRINGE
PREFILLED_SYRINGE | INTRAVENOUS | Status: AC
  Filled 2023-04-11: qty 10

## 2023-04-11 MED ORDER — GABAPENTIN 300 MG PO CAPS
ORAL_CAPSULE | ORAL | Status: AC
  Filled 2023-04-11: qty 1

## 2023-04-11 MED ORDER — ACETAMINOPHEN 325 MG PO TABS: 650.0000 mg | ORAL_TABLET | ORAL | Status: DC | PRN

## 2023-04-11 MED ORDER — FENTANYL CITRATE (PF) 100 MCG/2ML IJ SOLN
INTRAMUSCULAR | Status: DC | PRN
  Administered 2023-04-11 (×3): 50 ug via INTRAVENOUS

## 2023-04-11 MED ORDER — ONDANSETRON HCL 4 MG/2ML IJ SOLN
INTRAMUSCULAR | Status: DC | PRN
  Administered 2023-04-11: 4 mg via INTRAVENOUS

## 2023-04-11 MED ORDER — 0.9 % SODIUM CHLORIDE (POUR BTL) OPTIME
TOPICAL | Status: DC | PRN
  Administered 2023-04-11: 1000 mL

## 2023-04-11 MED ORDER — BUPIVACAINE HCL 0.5 % IJ SOLN
INTRAMUSCULAR | Status: DC | PRN
  Administered 2023-04-11: 20 mL

## 2023-04-11 MED ORDER — MIDAZOLAM HCL 2 MG/2ML IJ SOLN
INTRAMUSCULAR | Status: AC
  Filled 2023-04-11: qty 2

## 2023-04-11 MED ORDER — ACETAMINOPHEN 500 MG PO TABS
1000.0000 mg | ORAL_TABLET | ORAL | Status: AC
  Administered 2023-04-11: 1000 mg via ORAL

## 2023-04-11 MED ORDER — SUCCINYLCHOLINE CHLORIDE 200 MG/10ML IV SOSY
PREFILLED_SYRINGE | INTRAVENOUS | Status: DC | PRN
  Administered 2023-04-11: 100 mg via INTRAVENOUS

## 2023-04-11 MED ORDER — PHENYLEPHRINE 80 MCG/ML (10ML) SYRINGE FOR IV PUSH (FOR BLOOD PRESSURE SUPPORT)
PREFILLED_SYRINGE | INTRAVENOUS | Status: AC
  Filled 2023-04-11: qty 10

## 2023-04-11 MED ORDER — ACETAMINOPHEN 500 MG PO TABS
ORAL_TABLET | ORAL | Status: AC
  Filled 2023-04-11: qty 2

## 2023-04-11 MED ORDER — SODIUM CHLORIDE 0.9 % IV SOLN: INTRAVENOUS | Status: DC

## 2023-04-11 MED ORDER — HEMOSTATIC AGENTS (NO CHARGE) OPTIME
TOPICAL | Status: DC | PRN
  Administered 2023-04-11: 1 via TOPICAL

## 2023-04-11 MED ORDER — BUPIVACAINE HCL (PF) 0.5 % IJ SOLN
INTRAMUSCULAR | Status: AC
  Filled 2023-04-11: qty 30

## 2023-04-11 MED ORDER — OXYCODONE HCL 5 MG/5ML PO SOLN: 5.0000 mg | Freq: Once | ORAL | Status: AC | PRN

## 2023-04-11 MED ORDER — CHLORHEXIDINE GLUCONATE 0.12 % MT SOLN
15.0000 mL | Freq: Once | OROMUCOSAL | Status: AC
  Administered 2023-04-11: 15 mL via OROMUCOSAL

## 2023-04-11 MED ORDER — LACTATED RINGERS IV SOLN: INTRAVENOUS | Status: DC

## 2023-04-11 MED ORDER — ONDANSETRON HCL 4 MG/2ML IJ SOLN: 4.0000 mg | Freq: Once | INTRAMUSCULAR | Status: DC | PRN

## 2023-04-11 MED ORDER — PROPOFOL 10 MG/ML IV BOLUS
INTRAVENOUS | Status: DC | PRN
  Administered 2023-04-11: 150 mg via INTRAVENOUS
  Administered 2023-04-11: 150 ug/kg/min via INTRAVENOUS

## 2023-04-11 MED ORDER — LIDOCAINE HCL (CARDIAC) PF 100 MG/5ML IV SOSY
PREFILLED_SYRINGE | INTRAVENOUS | Status: DC | PRN
  Administered 2023-04-11: 100 mg via INTRAVENOUS

## 2023-04-11 MED ORDER — PROPOFOL 1000 MG/100ML IV EMUL
INTRAVENOUS | Status: AC
  Filled 2023-04-11: qty 100

## 2023-04-11 MED ORDER — KETOROLAC TROMETHAMINE 30 MG/ML IJ SOLN
INTRAMUSCULAR | Status: DC | PRN
  Administered 2023-04-11: 30 mg via INTRAVENOUS

## 2023-04-11 MED ORDER — POVIDONE-IODINE 10 % EX SWAB
2.0000 | Freq: Once | CUTANEOUS | Status: AC
  Administered 2023-04-11: 2 via TOPICAL

## 2023-04-11 MED ORDER — KETOROLAC TROMETHAMINE 30 MG/ML IJ SOLN
INTRAMUSCULAR | Status: AC
  Filled 2023-04-11: qty 1

## 2023-04-11 MED ORDER — DEXAMETHASONE SODIUM PHOSPHATE 10 MG/ML IJ SOLN
INTRAMUSCULAR | Status: DC | PRN
  Administered 2023-04-11: 10 mg via INTRAVENOUS

## 2023-04-11 MED ORDER — GABAPENTIN 300 MG PO CAPS
300.0000 mg | ORAL_CAPSULE | ORAL | Status: AC
Start: 2023-04-11 — End: 2023-04-11
  Administered 2023-04-11: 300 mg via ORAL

## 2023-04-11 MED ORDER — LACTATED RINGERS IV SOLN: INTRAVENOUS | Status: DC | PRN

## 2023-04-11 MED ORDER — SODIUM CHLORIDE 0.9% FLUSH: 3.0000 mL | Freq: Two times a day (BID) | INTRAVENOUS | Status: DC

## 2023-04-11 MED ORDER — MIDAZOLAM HCL 2 MG/2ML IJ SOLN
INTRAMUSCULAR | Status: DC | PRN
  Administered 2023-04-11: 2 mg via INTRAVENOUS

## 2023-04-11 MED ORDER — SODIUM CHLORIDE 0.9 % IV SOLN: 250.0000 mL | INTRAVENOUS | Status: DC | PRN

## 2023-04-11 MED ORDER — ACETAMINOPHEN 10 MG/ML IV SOLN: 1000.0000 mg | Freq: Once | INTRAVENOUS | Status: DC | PRN

## 2023-04-11 MED ORDER — OXYCODONE HCL 5 MG PO TABS
5.0000 mg | ORAL_TABLET | Freq: Once | ORAL | Status: AC | PRN
  Administered 2023-04-11: 5 mg via ORAL

## 2023-04-11 MED ORDER — OXYCODONE HCL 5 MG PO TABS: 5.0000 mg | ORAL_TABLET | ORAL | Status: DC | PRN

## 2023-04-11 MED ORDER — ORAL CARE MOUTH RINSE: 15.0000 mL | Freq: Once | OROMUCOSAL | Status: AC

## 2023-04-11 MED ORDER — CHLORHEXIDINE GLUCONATE 0.12 % MT SOLN
OROMUCOSAL | Status: AC
  Filled 2023-04-11: qty 15

## 2023-04-11 MED ORDER — METHOCARBAMOL 500 MG PO TABS: 500.0000 mg | ORAL_TABLET | Freq: Four times a day (QID) | ORAL | Status: DC | PRN

## 2023-04-11 MED ORDER — SUGAMMADEX SODIUM 200 MG/2ML IV SOLN
INTRAVENOUS | Status: DC | PRN
  Administered 2023-04-11: 200 mg via INTRAVENOUS

## 2023-04-11 MED ORDER — PHENYLEPHRINE 80 MCG/ML (10ML) SYRINGE FOR IV PUSH (FOR BLOOD PRESSURE SUPPORT)
PREFILLED_SYRINGE | INTRAVENOUS | Status: DC | PRN
  Administered 2023-04-11 (×2): 160 ug via INTRAVENOUS

## 2023-04-11 MED ORDER — OXYCODONE HCL 5 MG PO TABS
ORAL_TABLET | ORAL | Status: AC
  Filled 2023-04-11: qty 1

## 2023-04-11 SURGICAL SUPPLY — 49 items
APPLICATOR ARISTA FLEXITIP XL (MISCELLANEOUS) ×1 IMPLANT
BAG URINE DRAIN 2000ML AR STRL (UROLOGICAL SUPPLIES) ×1 IMPLANT
BLADE SURG SZ11 CARB STEEL (BLADE) ×1 IMPLANT
CATH URTH 16FR FL 2W BLN LF (CATHETERS) ×1 IMPLANT
CORD MONOPOLAR M/FML 12FT (MISCELLANEOUS) ×1 IMPLANT
DERMABOND ADVANCED .7 DNX12 (GAUZE/BANDAGES/DRESSINGS) ×1 IMPLANT
DRAPE STERI POUCH LG 24X46 STR (DRAPES) IMPLANT
GAUZE 4X4 16PLY ~~LOC~~+RFID DBL (SPONGE) ×1 IMPLANT
GLOVE BIO SURGEON STRL SZ7 (GLOVE) ×2 IMPLANT
GLOVE INDICATOR 7.5 STRL GRN (GLOVE) ×1 IMPLANT
GLOVE SURG SYN 8.0 (GLOVE) ×1
GLOVE SURG SYN 8.0 PF PI (GLOVE) ×1 IMPLANT
GOWN STRL REUS W/ TWL LRG LVL3 (GOWN DISPOSABLE) ×2 IMPLANT
GOWN STRL REUS W/ TWL XL LVL3 (GOWN DISPOSABLE) ×1 IMPLANT
GRASPER SUT TROCAR 14GX15 (MISCELLANEOUS) IMPLANT
HEMOSTAT ARISTA ABSORB 3G PWDR (HEMOSTASIS) IMPLANT
IRRIGATION STRYKERFLOW (MISCELLANEOUS) IMPLANT
IRRIGATOR STRYKERFLOW (MISCELLANEOUS)
IV NS 1000ML BAXH (IV SOLUTION) ×1 IMPLANT
KIT PINK PAD W/HEAD ARE REST (MISCELLANEOUS) ×1
KIT PINK PAD W/HEAD ARM REST (MISCELLANEOUS) ×1 IMPLANT
KIT TURNOVER CYSTO (KITS) ×1 IMPLANT
L-HOOK LAP DISP 36CM (ELECTROSURGICAL)
LABEL OR SOLS (LABEL) ×1 IMPLANT
LHOOK LAP DISP 36CM (ELECTROSURGICAL) IMPLANT
LIGASURE VESSEL 5MM BLUNT TIP (ELECTROSURGICAL) IMPLANT
MANIFOLD NEPTUNE II (INSTRUMENTS) ×1 IMPLANT
MANIPULATOR UTERINE 4.5 ZUMI (MISCELLANEOUS) IMPLANT
NS IRRIG 500ML POUR BTL (IV SOLUTION) ×1 IMPLANT
PACK GYN LAPAROSCOPIC (MISCELLANEOUS) ×1 IMPLANT
PAD OB MATERNITY 11 LF (PERSONAL CARE ITEMS) ×1 IMPLANT
PAD PREP OB/GYN DISP 24X41 (PERSONAL CARE ITEMS) ×1 IMPLANT
SCISSORS METZENBAUM CVD 33 (INSTRUMENTS) IMPLANT
SCRUB CHG 4% DYNA-HEX 4OZ (MISCELLANEOUS) ×1 IMPLANT
SET TUBE SMOKE EVAC HIGH FLOW (TUBING) ×1 IMPLANT
SLEEVE Z-THREAD 5X100MM (TROCAR) ×1 IMPLANT
SOL PREP PVP 2OZ (MISCELLANEOUS) ×1
SOLUTION PREP PVP 2OZ (MISCELLANEOUS) ×1 IMPLANT
STRIP CLOSURE SKIN 1/4X4 (GAUZE/BANDAGES/DRESSINGS) IMPLANT
SUT MNCRL AB 4-0 PS2 18 (SUTURE) ×1 IMPLANT
SUT VICRYL 0 AB UR-6 (SUTURE) IMPLANT
SYR 50ML LL SCALE MARK (SYRINGE) IMPLANT
SYR 5ML LL (SYRINGE) IMPLANT
SYS BAG RETRIEVAL 10MM (BASKET)
SYSTEM BAG RETRIEVAL 10MM (BASKET) IMPLANT
TRAP FLUID SMOKE EVACUATOR (MISCELLANEOUS) ×1 IMPLANT
TROCAR Z-THREAD FIOS 5X100MM (TROCAR) ×1 IMPLANT
TUBING ART PRESS 48 MALE/FEM (TUBING) IMPLANT
WATER STERILE IRR 500ML POUR (IV SOLUTION) ×1 IMPLANT

## 2023-04-11 NOTE — Plan of Care (Addendum)
 Spoke to patient's cousin post-op and plan to discuss later with patient. Unable to complete procedure due to large bulky uterus and intra-abdominal adhesions. Would recommend total abdominal hysterectomy and right salpingo-oophorectomy. RA TLH would be difficult due to inability to manipulate uterus adequately and inability to visualize posteriorly. In my opinion, abdominal hysterectomy would be the safest route. Would recommend bowel prep pre-operatively. Will discuss with patient.   Beverli LULLA Dinsmore, MD 04/11/2023 11:25 AM  --- Called patient to discuss the above. Discussed options of observation vs exploratory laparotomy and RSO vs TAH RSO. Recommended TAH BSO but discussed that either option of ex lap/RSO or TAH RSO would be reasonable. Discussed recommendation for still removing the right ovary due to persistent ovarian mass and need for pathologic diagnosis. Discussed recommendation for hysterectomy due to size of uterus. Patient reports heavy painful menses and a lot of pelvic pain. Discussed that a hysterectomy would not only help patient's symptoms but overall may be an easier surgery because removal of just the ovary may be difficult due to the size of the uterus. Patient will think about options. Briefly discussed recovery process of both surgeries. Will discuss more at post op appointment.   Beverli LULLA Dinsmore, MD 04/11/2023 4:32 PM

## 2023-04-11 NOTE — Anesthesia Procedure Notes (Signed)
 Procedure Name: Intubation Date/Time: 04/11/2023 9:20 AM  Performed by: Bonnetta Jimmey SAUNDERS, CRNAPre-anesthesia Checklist: Patient identified, Emergency Drugs available, Suction available and Patient being monitored Patient Re-evaluated:Patient Re-evaluated prior to induction Oxygen Delivery Method: Circle system utilized Preoxygenation: Pre-oxygenation with 100% oxygen Induction Type: IV induction Ventilation: Mask ventilation without difficulty Laryngoscope Size: McGrath and 3 Grade View: Grade I Tube type: Oral Tube size: 6.5 mm Number of attempts: 1 Airway Equipment and Method: Stylet and Oral airway Placement Confirmation: ETT inserted through vocal cords under direct vision, positive ETCO2 and breath sounds checked- equal and bilateral Secured at: 22 cm Tube secured with: Tape Dental Injury: Teeth and Oropharynx as per pre-operative assessment

## 2023-04-11 NOTE — Anesthesia Postprocedure Evaluation (Signed)
 Anesthesia Post Note  Patient: Maureen Schwartz  Procedure(s) Performed: LAPAROSCOPY DIAGNOSTIC (Pelvis)  Anesthesia Type: General Anesthetic complications: no   There were no known notable events for this encounter.   Last Vitals:  Vitals:   04/11/23 1115 04/11/23 1133  BP: (!) 141/88 (!) 149/91  Pulse: 82 80  Resp: 20 16  Temp: (!) 36.2 C 36.6 C  SpO2: 95% 95%    Last Pain:  Vitals:   04/11/23 1133  TempSrc: Temporal  PainSc: 3                  Rome Ade

## 2023-04-11 NOTE — Transfer of Care (Signed)
 Immediate Anesthesia Transfer of Care Note  Patient: Maureen Schwartz  Procedure(s) Performed: LAPAROSCOPY DIAGNOSTIC (Pelvis)  Patient Location: PACU  Anesthesia Type:General  Level of Consciousness: awake  Airway & Oxygen Therapy: Patient Spontanous Breathing and Patient connected to face mask oxygen  Post-op Assessment: Report given to RN and Post -op Vital signs reviewed and stable  Post vital signs: Reviewed and stable  Last Vitals:  Vitals Value Taken Time  BP 133/88 04/11/23 1046  Temp    Pulse 95 04/11/23 1048  Resp 32 04/11/23 1048  SpO2 98 % 04/11/23 1048  Vitals shown include unfiled device data.  Last Pain:  Vitals:   04/11/23 0741  TempSrc: Oral  PainSc: 0-No pain      Patients Stated Pain Goal: 0 (04/11/23 0741)  Complications: There were no known notable events for this encounter.

## 2023-04-11 NOTE — Interval H&P Note (Signed)
 History and Physical Interval Note:  04/11/2023 8:56 AM  Maureen Schwartz  has presented today for surgery, with the diagnosis of ovarian mass.  The various methods of treatment have been discussed with the patient and family. After consideration of risks, benefits and other options for treatment, the patient has consented to  Procedure(s): LAPAROSCOPY DIAGNOSTIC -REMOVAL OF PELVIC MASS (N/A) as a surgical intervention.  Re-discussed plan and plan to remove affected ovary and fallopian tube per patient's wishes 9Plan for unilateral salpingo-oophorectomy). Plan to leave other ovary and fallopian tube. The patient's history has been reviewed, patient examined, no change in status, stable for surgery.  I have reviewed the patient's chart and labs.  Questions were answered to the patient's satisfaction.     Jaritza Duignan V Alyn Riedinger

## 2023-04-11 NOTE — Anesthesia Preprocedure Evaluation (Signed)
 Anesthesia Evaluation  Patient identified by MRN, date of birth, ID band Patient awake    Reviewed: Allergy & Precautions, NPO status , Patient's Chart, lab work & pertinent test results  History of Anesthesia Complications Negative for: history of anesthetic complications  Airway Mallampati: II  TM Distance: >3 FB Neck ROM: Full    Dental no notable dental hx. (+) Teeth Intact   Pulmonary neg pulmonary ROS, neg sleep apnea, neg COPD, Patient abstained from smoking.Not current smoker, former smoker   Pulmonary exam normal breath sounds clear to auscultation       Cardiovascular Exercise Tolerance: Good METShypertension, Pt. on medications (-) CAD and (-) Past MI (-) dysrhythmias  Rhythm:Regular Rate:Normal - Systolic murmurs    Neuro/Psych  Headaches PSYCHIATRIC DISORDERS Anxiety Depression       GI/Hepatic ,neg GERD  ,,(+)     (-) substance abuse    Endo/Other  neg diabetes    Renal/GU negative Renal ROS     Musculoskeletal   Abdominal  (+) + obese  Peds  Hematology  (+) Blood dyscrasia, anemia   Anesthesia Other Findings Past Medical History: No date: Anemia No date: Anxiety No date: Arthritis No date: Depression No date: Fibroid No date: Headache No date: Herpes     Comment:  last outbreak July 2016 No date: Hypertension No date: Pre-diabetes 06/16/2015: Status post vacuum-assisted vaginal delivery (3/17) No date: Vitamin D deficiency  Reproductive/Obstetrics                             Anesthesia Physical Anesthesia Plan  ASA: 2  Anesthesia Plan: General   Post-op Pain Management: Tylenol  PO (pre-op)*, Gabapentin  PO (pre-op)* and Toradol  IV (intra-op)*   Induction: Intravenous  PONV Risk Score and Plan: 4 or greater and Ondansetron , Dexamethasone , Midazolam , TIVA and Propofol  infusion  Airway Management Planned: Oral ETT and Video Laryngoscope Planned  Additional  Equipment: None  Intra-op Plan:   Post-operative Plan: Extubation in OR  Informed Consent: I have reviewed the patients History and Physical, chart, labs and discussed the procedure including the risks, benefits and alternatives for the proposed anesthesia with the patient or authorized representative who has indicated his/her understanding and acceptance.     Dental advisory given  Plan Discussed with: CRNA and Surgeon  Anesthesia Plan Comments: (Discussed risks of anesthesia with patient, including PONV, sore throat, lip/dental/eye damage. Rare risks discussed as well, such as cardiorespiratory and neurological sequelae, and allergic reactions. Discussed the role of CRNA in patient's perioperative care. Patient understands.)       Anesthesia Quick Evaluation

## 2023-04-11 NOTE — Op Note (Addendum)
 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 Uterus sounded to 13. Brisk bleeding from all laparoscopic incisions and tenaculum sites. Hemosiderin deposits throughout abdomen- on anterior peritoneum, lower uterine segment, and epiploica. Due to oozing, decision made not to biopsy these areas. Liver edge normal appearing. Adhesive band over anterior surface of uterus. 17 week large bulbous fibroid uterus (fills pelvis up to 2 cm below umbilicus). Unable to adequately manipulate uterus even with Zumi uterine manipulator. Unable to visualize left adnexa. Bowel adhered in right lower quadrant which made visualization of right adnexa extremely difficult.  Briefly able to visualize 1 cm of right ovary with extreme difficulty but unable to completely visualize right adnexa to identify ovarian mass. Small area of right fallopian tube visualized. Unable to visualize posterior cul de sac.    Procedures: The risks, benefits, alternatives, and indications of the procedure were discussed with the patient.  She voiced understanding of the procedure and signed surgical consent.     The patient was prepped and draped in the usual sterile fashion. A time out was performed with all parties present in agreement.  Speculum was placed with good visualization of the cervix which appeared prominent but normal. A hulka manipulator was placed. A foley catheter was placed. Gown and gloves changed.    Attention was then turned to the abdomen  after gloves were changed. 10cc of 0.5% bupivicaine was injected inferior to the umbilicus. A 5mm vertical incision was made below the umbilicus and a trocar was placed under direct visualization. Once entry was confirmed the abdomen was insufflated with CO2.  The camera was then reinserted inside the abdomen with good visualization of the pelvis.  Findings listed above. Next, 5cc of 0.5% bupivicaine was injected in the right lower quadrant at intended incisional site. A 5 mm incisioin was then placed in the right lower quadrant to accommodate a 5 mm trocar. Lastly, 5cc of 0.5% bupivicaine was injected in the left lower quadrant at intended incisional site. An 11 mm incision was then placed in the left lower quadrant to accommodate a 10 mm trocar.  The trocar was placed without difficulty. Visualization difficult due to large fibroid uterus and adhesions.     Attention retturned to vagina. Speculum was replaced. The hulka tenaculum was removed. Cervix was dilated to allow insertion of uterine sound. A Zumi manipulator was then placed in the uterus. Unable to manipulate the uterus due to its size even with the Zumi manipulator. Gown and gloves again changed. Bowel remained lodged in the pelvis and was not able to be moved due to adhesions. Still, unable to visualize right ovary entirely. Due to inability to visualize adnexa, decision made to terminate procedure in order to further discuss surgical options with patient. Intra-abdominal pressure was decreased to 5 mmHg and hemostasis noted. Area of previous oozing on uterine serosa from manipulation was hemostatic. Artista was placed over the area. Hemostasis noted. All trocar sites were visualized internally and hemostatic. The fascia was closed at the 10mm incision with 0-vicryl. The abdomen was deflated and trocars were removed with  use of Q-tip. Skin incisions were closed with 4 Monocryl and dermabond.     Zumi manipulator and foley catheter were removed.  The  cervix was noted to be hemostatic. The patient tolerated the procedure well. Anesthesia was reversed.  The instrument, sponge, and needle counts were reported correct x 2.  The patient was taken to the recovery room in stable condition.     Beverli LULLA Dinsmore, MD, 04/11/2023, 10:36 AM

## 2023-04-12 ENCOUNTER — Encounter: Payer: Self-pay | Admitting: Obstetrics and Gynecology

## 2023-04-30 LAB — BPAM RBC
Blood Product Expiration Date: 202502022359
Blood Product Expiration Date: 202502022359
ISSUE DATE / TIME: 202501170955
ISSUE DATE / TIME: 202501171042
Unit Type and Rh: 5100
Unit Type and Rh: 5100

## 2023-04-30 LAB — TYPE AND SCREEN
ABO/RH(D): B POS
Antibody Screen: POSITIVE
PT AG Type: POSITIVE
Unit division: 0
Unit division: 0

## 2023-07-29 ENCOUNTER — Other Ambulatory Visit: Payer: Self-pay | Admitting: Obstetrics and Gynecology

## 2023-07-29 DIAGNOSIS — Z1231 Encounter for screening mammogram for malignant neoplasm of breast: Secondary | ICD-10-CM

## 2023-11-17 IMAGING — US US PELVIS COMPLETE TRANSABD/TRANSVAG W DUPLEX AND/OR DOPPLER
1 series · 13 of 25 positions shown · non-contrast
Comparison: MRI 08/07/2010

CLINICAL DATA: Bilateral pelvic pain

EXAM:
TRANSABDOMINAL AND TRANSVAGINAL ULTRASOUND OF PELVIS
DOPPLER ULTRASOUND OF OVARIES
TECHNIQUE: Both transabdominal and transvaginal ultrasound examinations of the
pelvis were performed. Transabdominal technique was performed for
global imaging of the pelvis including uterus, ovaries, adnexal
regions, and pelvic cul-de-sac.
It was necessary to proceed with endovaginal exam following the
transabdominal exam to visualize the ovaries. Color and duplex
Doppler ultrasound was utilized to evaluate blood flow to the
ovaries.

[Series 1: us pelvic complete w transvaginal and torsion righ · 13 of 118 slices shown]
[im 1/118]
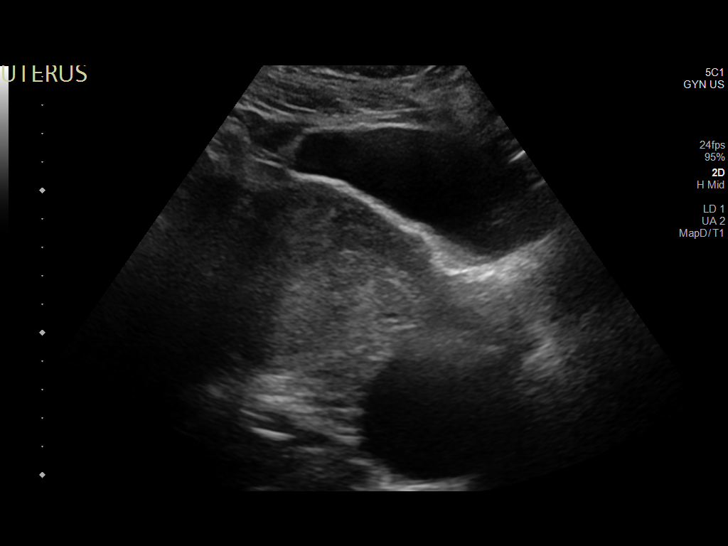
[im 10/118]
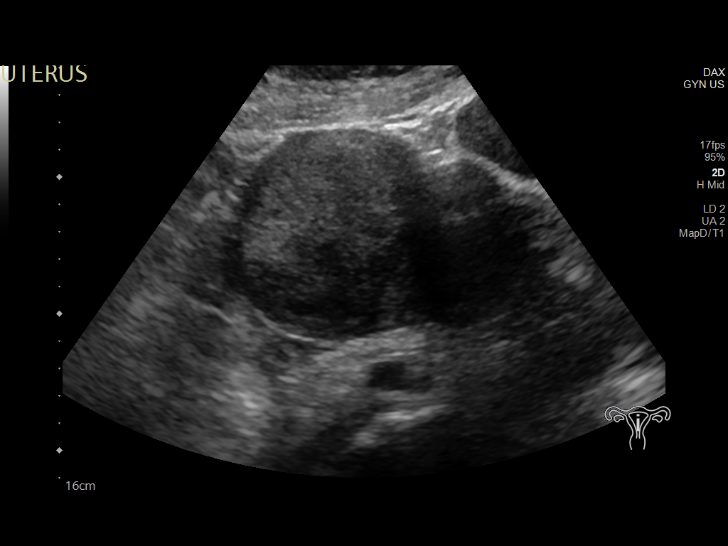
[im 20/118]
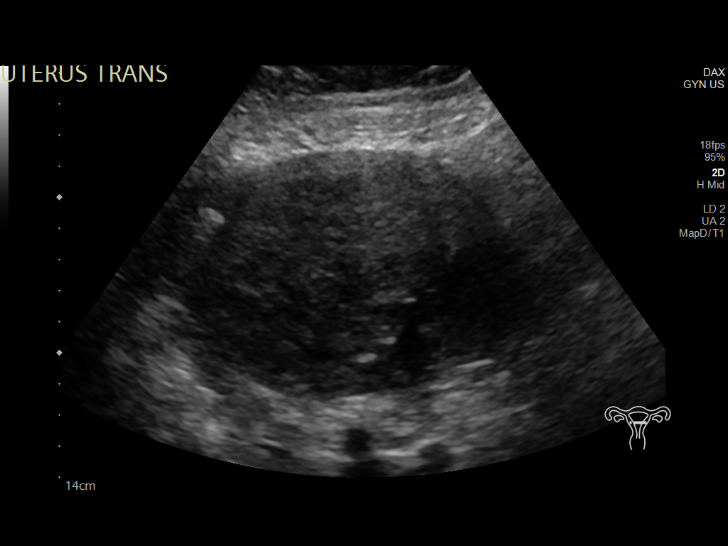
[im 30/118]
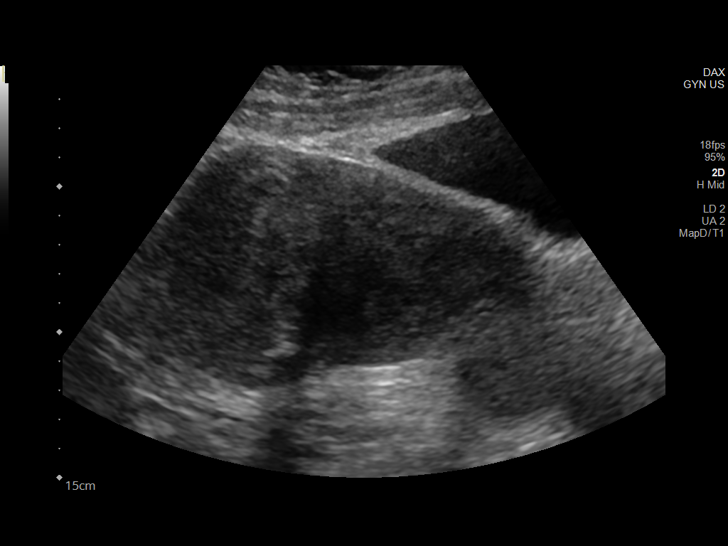
[im 40/118]
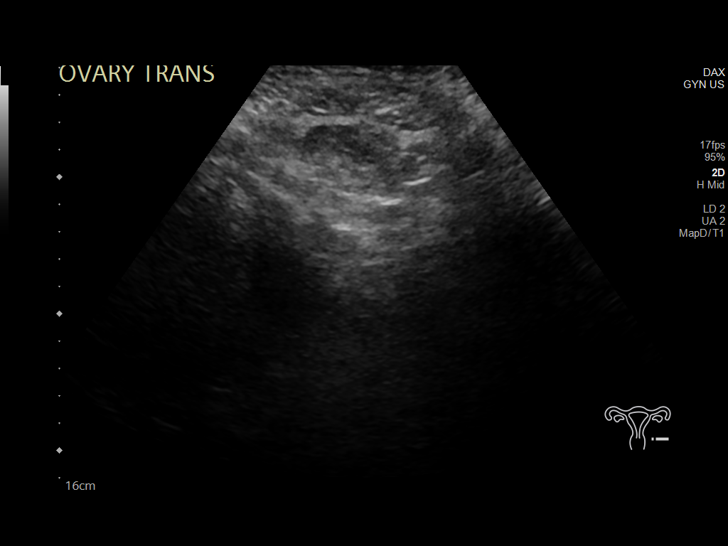
[im 49/118]
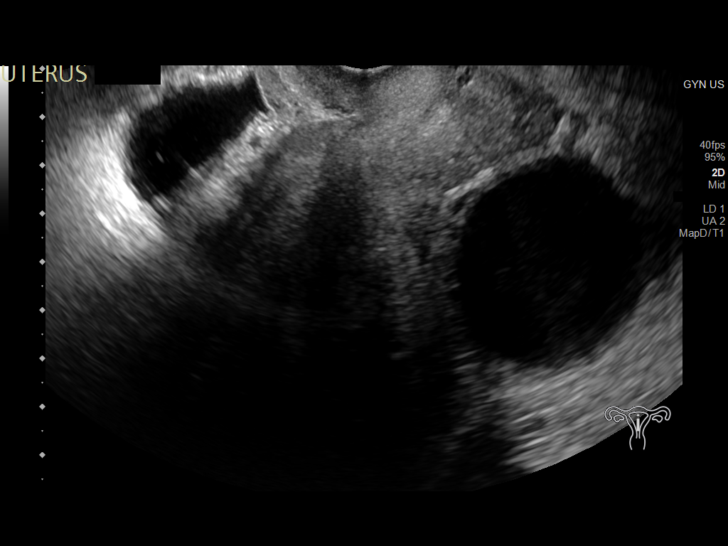
[im 59/118]
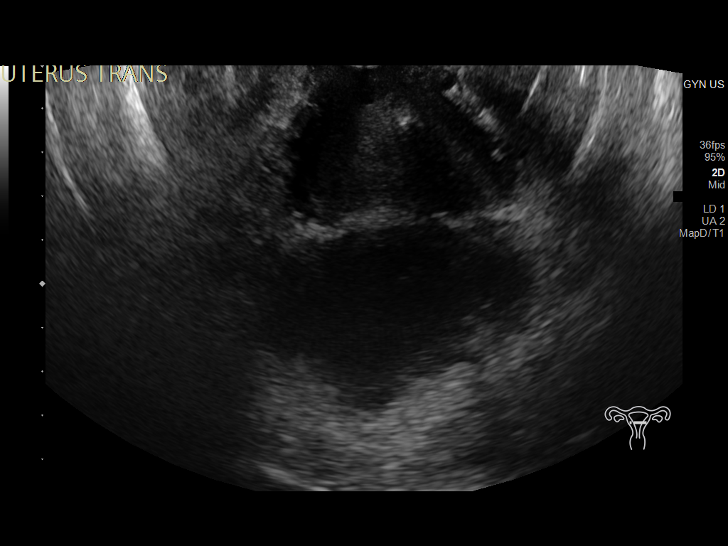
[im 69/118]
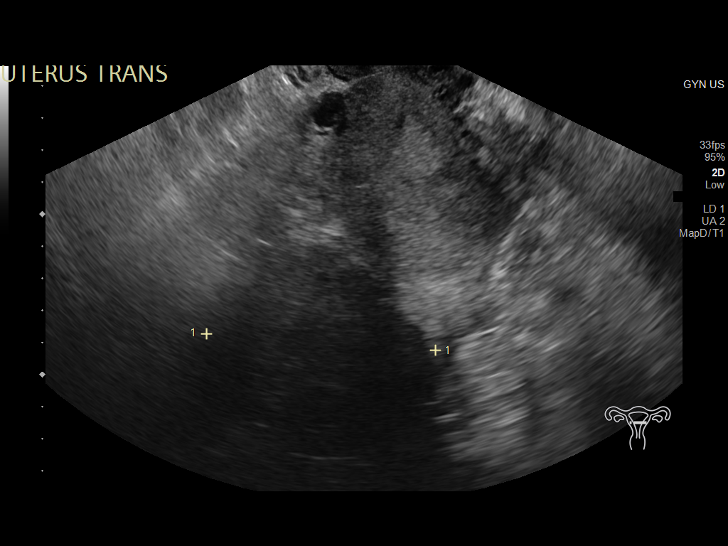
[im 79/118]
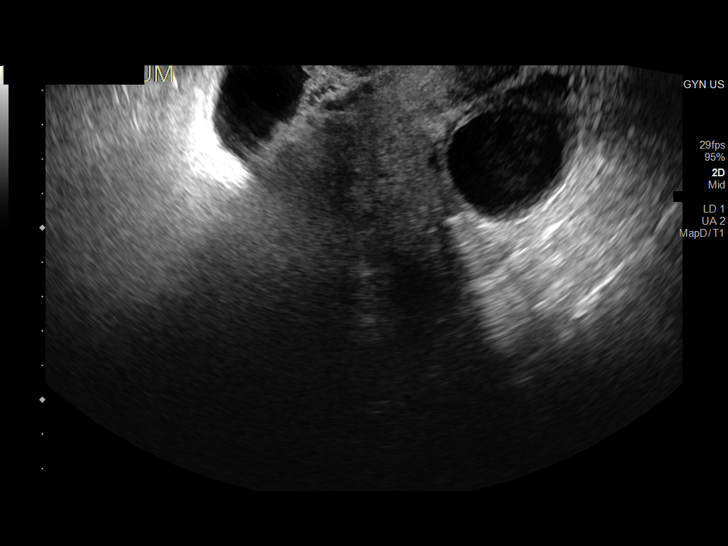
[im 88/118]
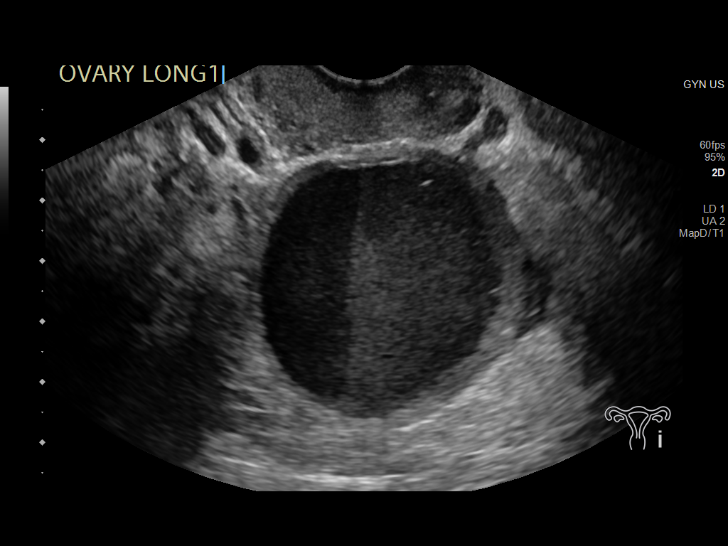
[im 98/118]
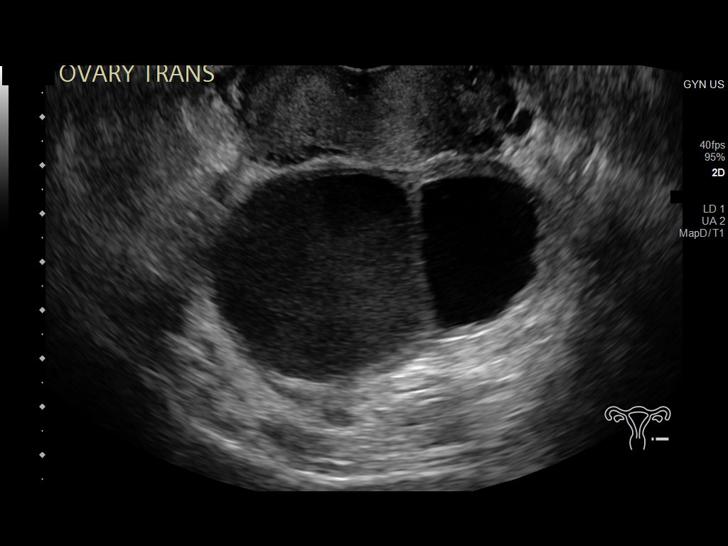
[im 108/118]
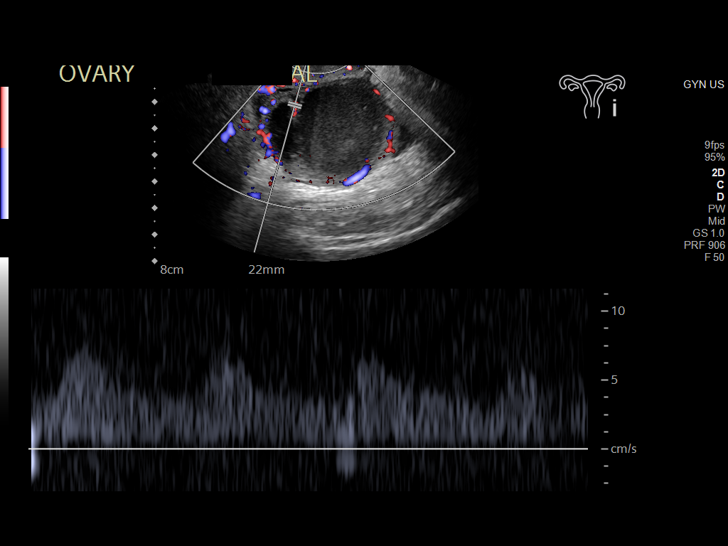
[im 118/118]
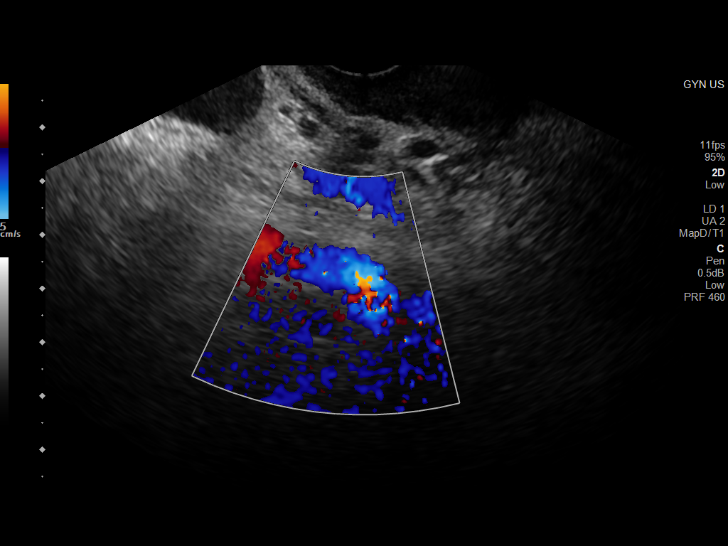

[13 of 25 positions shown; findings below may reference images not displayed]

FINDINGS: Uterus

Measurements: 16.3 x 8.2 x 11.8 cm = volume: 824 mL. Large fibroid
at the posterior uterine body/fundus measuring 8.4 x 8.0 x 8.3 cm.

Endometrium

Poorly visualized secondary to poor penetration. No obvious
abnormality.

Right ovary

Not visualized.

Left ovary

Measurements: 7.1 x 3.6 x 6.5 cm = volume: 86 mL. Complex 4.4 cm
left ovarian cyst with layering hypoechoic fluid level with low
level echoes. Additional adjacent 3.7 cm left ovarian cyst with fine
internal web-like reticulations.

Pulsed Doppler evaluation of the left ovary demonstrates normal
low-resistance arterial and venous waveforms.

Other findings

No abnormal free fluid.
IMPRESSION: 1. Nonvisualization of the right ovary. Right adnexal torsion is
unable to be excluded by this exam.
2. No evidence of left ovarian torsion.
3. Left ovary is enlarged containing a 4.4 cm complex cyst, possibly
endometrioma or hemorrhagic cyst. Additional 3.7 cm left ovarian
cyst, more typical of a hemorrhagic cyst. Follow-up ultrasound in
6-12 weeks is recommended.
4. Enlarged uterus containing a 8.4 cm fibroid.

## 2024-01-04 IMAGING — US US PELVIS COMPLETE WITH TRANSVAGINAL
1 series · 13 of 25 positions shown · non-contrast
Comparison: 05/23/2021

CLINICAL DATA: Follow-up of LEFT ovarian cyst, LMP 06/28/2021

EXAM:
TRANSABDOMINAL AND TRANSVAGINAL ULTRASOUND OF PELVIS
TECHNIQUE: Both transabdominal and transvaginal ultrasound examinations of the
pelvis were performed. Transabdominal technique was performed for
global imaging of the pelvis including uterus, ovaries, adnexal
regions, and pelvic cul-de-sac. It was necessary to proceed with
endovaginal exam following the transabdominal exam to visualize the
endometrium and ovaries.

[Series 1: us pelvis complete with transvaginal · 0.25mm/px · 13 of 96 slices shown]
[im 1/96]
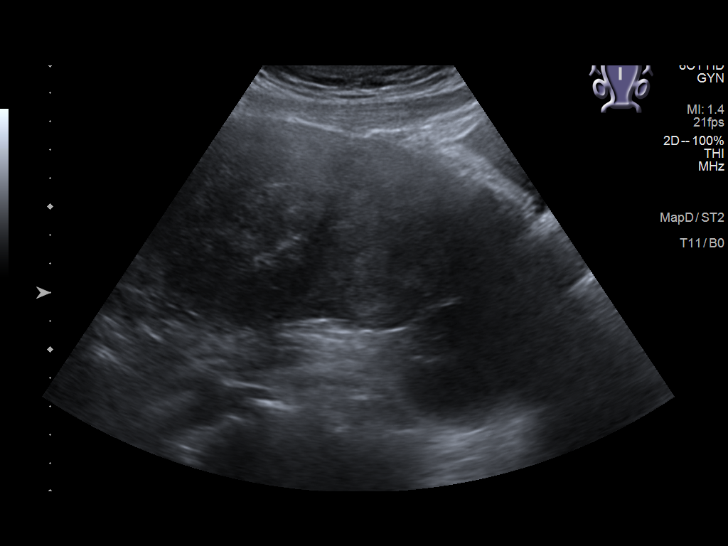
[im 8/96]
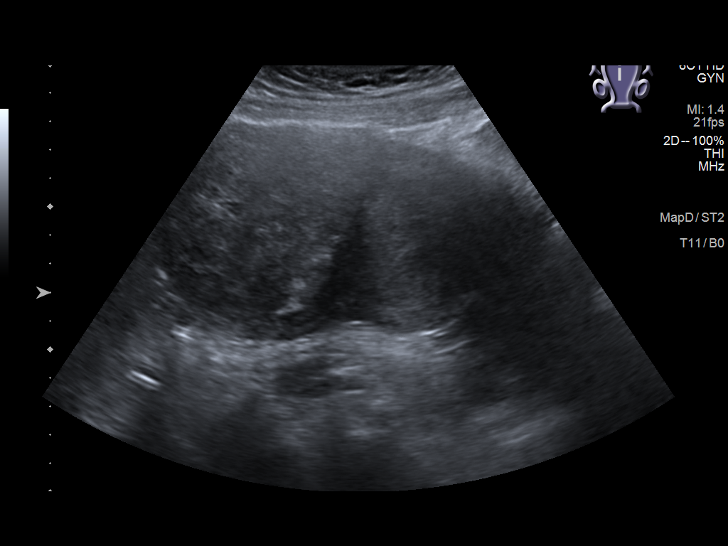
[im 16/96]
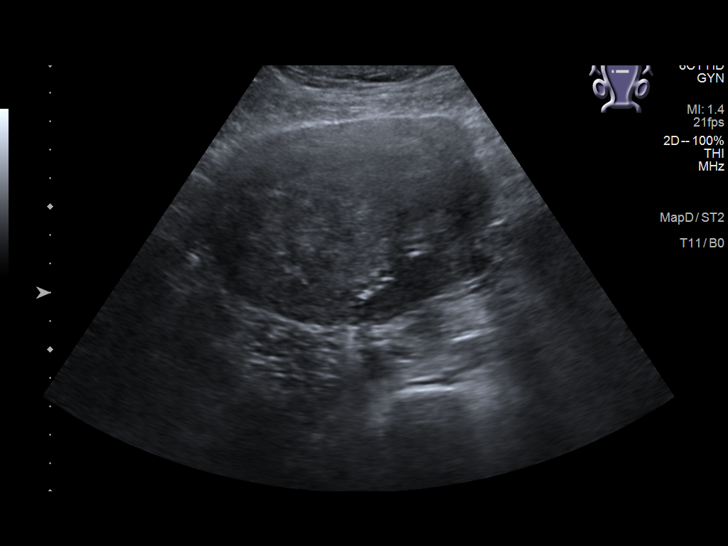
[im 24/96]
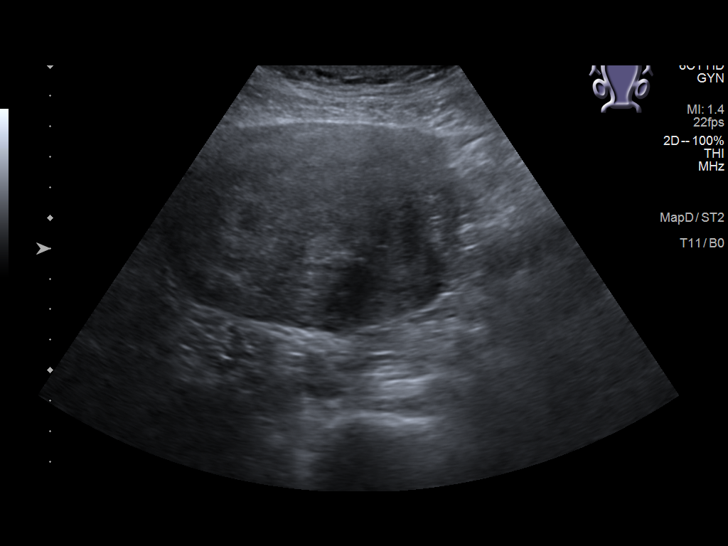
[im 32/96]
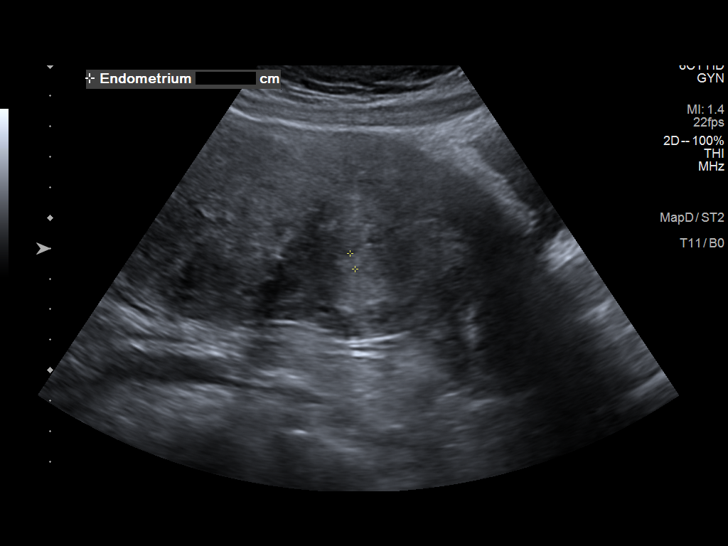
[im 40/96]
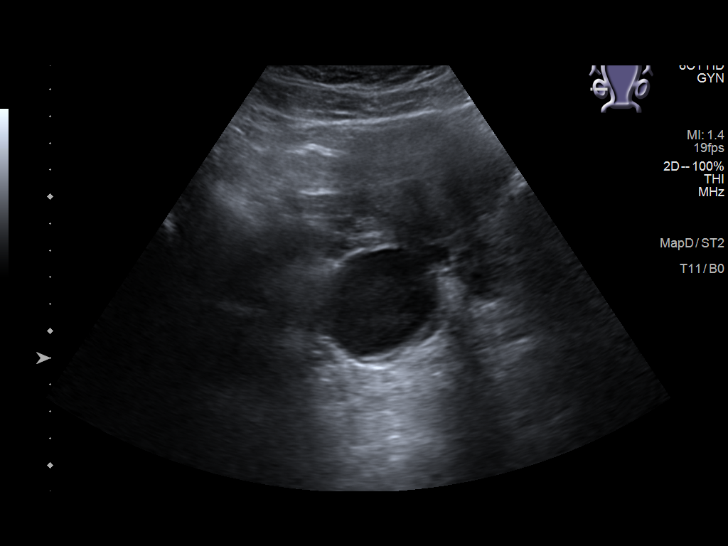
[im 48/96]
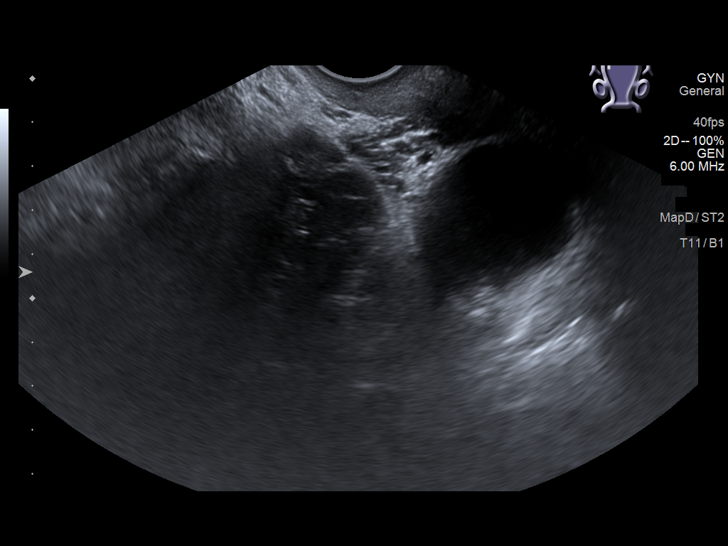
[im 56/96]
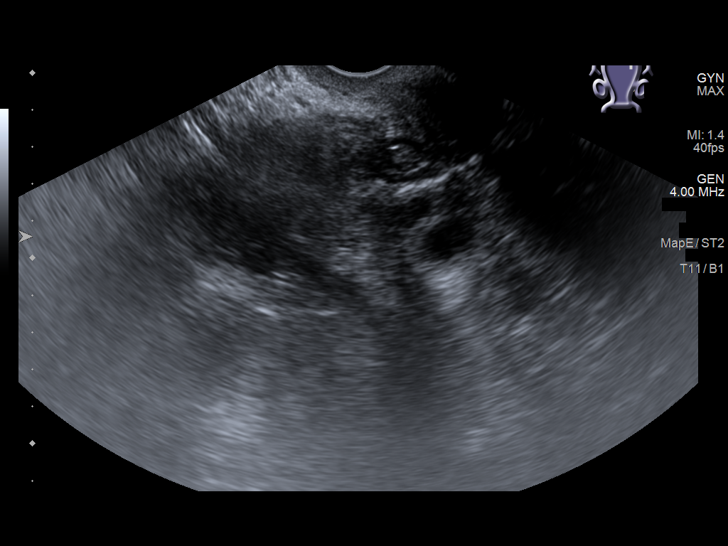
[im 64/96]
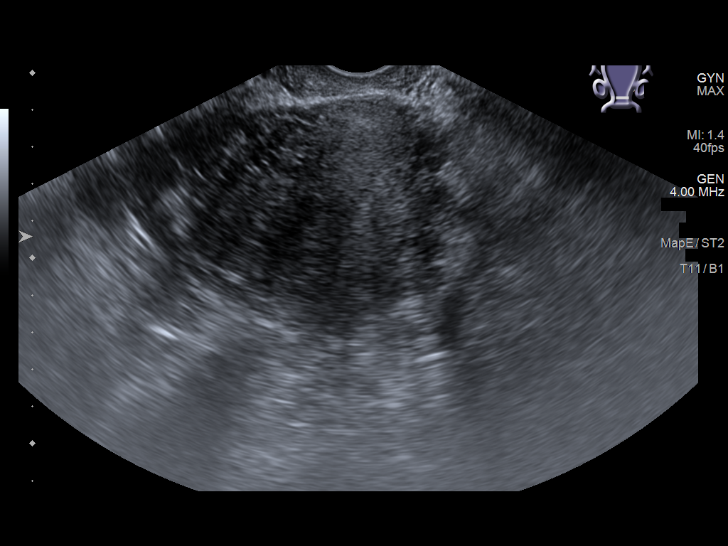
[im 72/96]
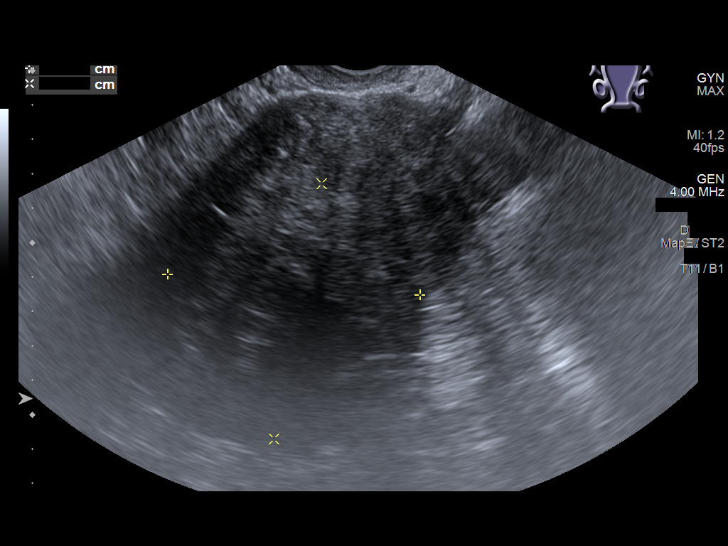
[im 80/96]
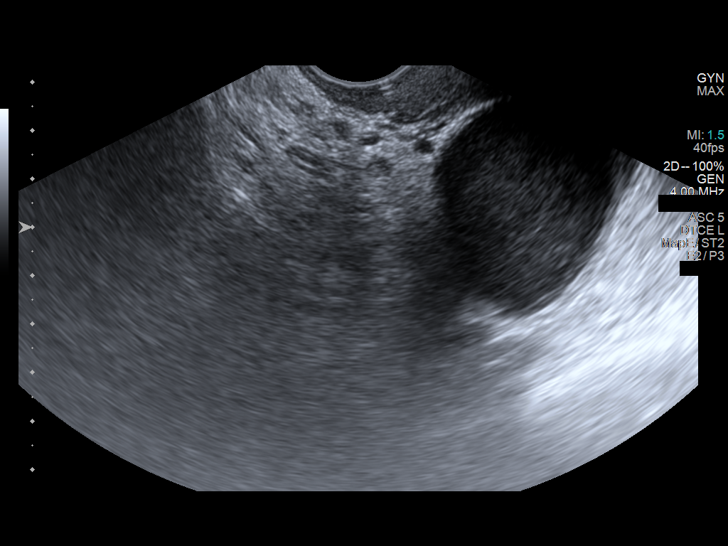
[im 88/96]
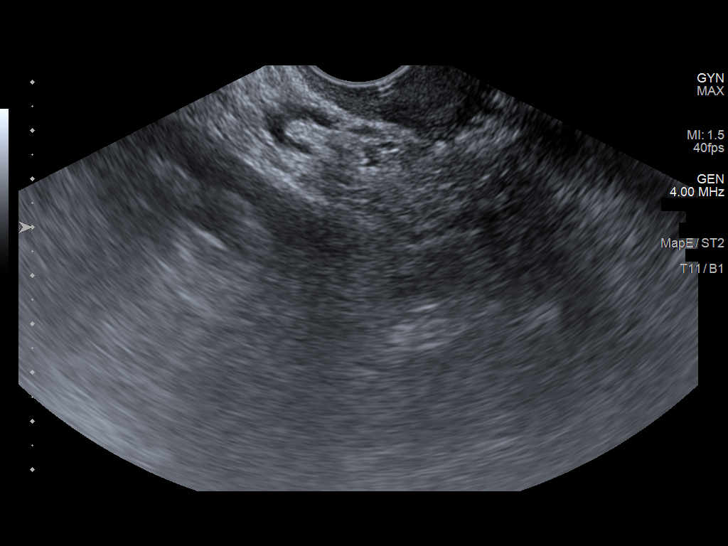
[im 96/96]
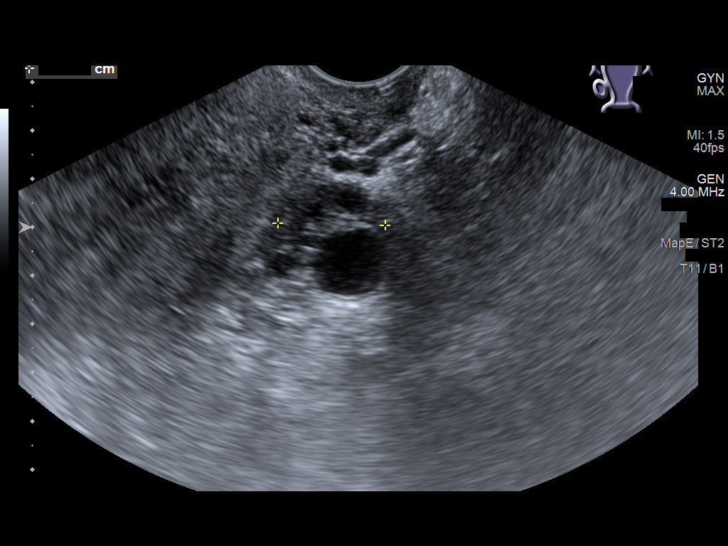

[13 of 25 positions shown; findings below may reference images not displayed]

FINDINGS: Uterus

Measurements: 15.0 x 7.4 x 10.1 cm = volume: 590 mL. Anteverted.
Heterogeneous myometrium. Large mass at uterine fundus consistent
with leiomyoma, exophytic, 8.4 x 7.6 x 6.9 cm. No additional
discrete mass.

Endometrium

Thickness: 15 mm.  No endometrial fluid or mass

Right ovary

Measurements: 4.5 x 4.2 x 4.8 cm = volume: 46.7 mL. Complex cyst
identified within RIGHT ovary, 4.1 x 4.0 x 3.9 cm, previously 4.0 x
4.4 x 4.3 cm, with fluid/fluid level seen on previous exam no longer
identified. Blood flow present within RIGHT ovary on color Doppler
imaging. Lesion is located within the cul-de-sac. Previously this
lesion was felt to be located within the LEFT ovary. RIGHT ovary is
not visualized on the previous study. A normal appearing LEFT ovary
is visualized on the current exam.

Left ovary

Measurements: 3.1 x 2.6 x 2.2 cm = volume: 9.2 mL. Normal morphology
without mass

Other findings

No free pelvic fluid.  No additional pelvic masses.
IMPRESSION: Large exophytic leiomyoma at uterine fundus 8.4 cm greatest
diameter.

Complicated cystic lesion again identified, located in cul-de-sac,
appears to arise from RIGHT ovary, with a separate normal appearing
LEFT ovary visualized on current study; previously this was felt to
arise from the LEFT ovary.

Cyst is minimally smaller than on the previous exam and the
previously seen hematocrit fluid/fluid level is no longer
identified.

This could either represent a hemorrhagic cyst or an endometrioma.

Consider either follow-up characterization by MR imaging with and
without contrast or continued follow-up ultrasound assessment.
